# Patient Record
Sex: Female | Born: 2000 | Race: Black or African American | Hispanic: Refuse to answer | Marital: Single
Health system: Midwestern US, Community
[De-identification: ages and names within clinical notes are randomized; demographics above are authoritative.]

## PROBLEM LIST (undated history)

## (undated) ENCOUNTER — Inpatient Hospital Stay (HOSPITAL_COMMUNITY): Payer: Self-pay

## (undated) ENCOUNTER — Ambulatory Visit (HOSPITAL_COMMUNITY): Admission: EM | Payer: Medicaid Other | Source: Home / Self Care

## (undated) DIAGNOSIS — Z789 Other specified health status: Secondary | ICD-10-CM

## (undated) DIAGNOSIS — Z3046 Encounter for surveillance of implantable subdermal contraceptive: Secondary | ICD-10-CM

## (undated) HISTORY — PX: NO PAST SURGERIES: SHX2092

---
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
## 2020-01-01 NOTE — Discharge Summary (Signed)
With: Address: When:   Hayes Ludwig, Physician - Neurologist [address], [address] [place], [place] [address]  [phone] Business (1) Within 1 week   Comments:   Your CAT scan here was normal, but I am concerned about these new headaches you are experiencing. I am writing you for a different medication for headaches called Bupap. 4 hours if needed. Do not take it if you do not need it. I have also placed referral to neurology. Please call their office today and schedule a follow-up appointment for sometime next week. Please return for new or worsening symptoms.              ED PROVIDER DOCUMENTATION     Patient:   Barbara Hanna, Barbara Hanna             MRN: [id number]            FIN: [id number]               Age:   19 years     Sex:  Female     DOB:  2001/06/13   Associated Diagnoses:   Headache; Headache, new daily persistent (NDPH)   Author:   Lurlean Leyden K-FNP      Basic Information   Time seen: Provider Seen (ST)   ED Provider/Time:    HUFF,  MEGAN K-FNP / 01/01/2020 11:59  .   Additional information: Chief Complaint from Nursing Triage Note   Chief Complaint  Chief Complaint: Pt c/o daily headaches x4 months. Denies any other s/s. Seen at [facility] and was given pills that pt states have not helped. (01/01/20 11:54:00).      History of Present Illness   The patient presents with headache.  The onset was 6  weeks  ago.  The course/duration of symptoms is episodic: lasting 20 minutes and with a frequency of  4-6 times daily.  Location: Right temporal. The character of symptoms is sharp and throbbing.  The degree at onset was moderate.  The degree at maximum was severe.  The degree at present is minimal.  The exacerbating factor is flashing lights.  The relieving factor is rest.  Risk factors consist of none.  Therapy today: prescription medications including nsaid given by urgent care.  Preceding symptoms: none.  Associated symptoms: nausea and Watery right eye, facial tingling.        Review of Systems   Constitutional symptoms:  No fever, no chills.    Eye symptoms:  Vision unchanged, No diplopia,    ENMT symptoms:  No ear pain, no sore throat, no nasal congestion, no sinus pain.    Respiratory symptoms:  No shortness of breath, no cough.    Cardiovascular symptoms:  No chest pain, no palpitations.    Gastrointestinal symptoms:  Nausea, no abdominal pain, no vomiting, no diarrhea.    Genitourinary symptoms:  No dysuria, no hematuria.    Neurologic symptoms:  Headache, tingling, no numbness, no focal weakness.              Additional review of systems information: All other systems reviewed and otherwise negative.      Health Status   Allergies:    Allergic Reactions (All)  No Known Medication Allergies.      Past Medical/ Family/ Social History   Problem list:    No qualifying data available  .      Physical Examination  With: Address: When:   Hayes Ludwig, Physician - Neurologist [address], [address] [place], [place] [address]  [phone] Business (1) Within 1 week   Comments:   Your CAT scan here was normal, but I am concerned about these new headaches you are experiencing. I am writing you for a different medication for headaches called Bupap. 4 hours if needed. Do not take it if you do not need it. I have also placed referral to neurology. Please call their office today and schedule a follow-up appointment for sometime next week. Please return for new or worsening symptoms.              ED PROVIDER DOCUMENTATION     Patient:   Barbara Hanna, Barbara Hanna             MRN: [id number]            FIN: [id number]               Age:   19 years     Sex:  Female     DOB:  2001/06/13   Associated Diagnoses:   Headache; Headache, new daily persistent (NDPH)   Author:   Lurlean Leyden K-FNP      Basic Information   Time seen: Provider Seen (ST)   ED Provider/Time:    HUFF,  MEGAN K-FNP / 01/01/2020 11:59  .   Additional information: Chief Complaint from Nursing Triage Note   Chief Complaint  Chief Complaint: Pt c/o daily headaches x4 months. Denies any other s/s. Seen at [facility] and was given pills that pt states have not helped. (01/01/20 11:54:00).      History of Present Illness   The patient presents with headache.  The onset was 6  weeks  ago.  The course/duration of symptoms is episodic: lasting 20 minutes and with a frequency of  4-6 times daily.  Location: Right temporal. The character of symptoms is sharp and throbbing.  The degree at onset was moderate.  The degree at maximum was severe.  The degree at present is minimal.  The exacerbating factor is flashing lights.  The relieving factor is rest.  Risk factors consist of none.  Therapy today: prescription medications including nsaid given by urgent care.  Preceding symptoms: none.  Associated symptoms: nausea and Watery right eye, facial tingling.        Review of Systems   Constitutional symptoms:  No fever, no chills.    Eye symptoms:  Vision unchanged, No diplopia,    ENMT symptoms:  No ear pain, no sore throat, no nasal congestion, no sinus pain.    Respiratory symptoms:  No shortness of breath, no cough.    Cardiovascular symptoms:  No chest pain, no palpitations.    Gastrointestinal symptoms:  Nausea, no abdominal pain, no vomiting, no diarrhea.    Genitourinary symptoms:  No dysuria, no hematuria.    Neurologic symptoms:  Headache, tingling, no numbness, no focal weakness.              Additional review of systems information: All other systems reviewed and otherwise negative.      Health Status   Allergies:    Allergic Reactions (All)  No Known Medication Allergies.      Past Medical/ Family/ Social History   Problem list:    No qualifying data available  .      Physical Examination  ED Clinical Summary                     [facility]  [address]  [place], [place] [address]  [phone]          PERSON INFORMATION  Name: Barbara Hanna, Barbara Hanna Age:  61 Years DOB: [birth year]   Sex: Female Language: English PCP: PCP,  NONE   Marital Status:  Phone: [phone] Med Service: Harle Stanford   MRN: [id number] Acct# [id number] Arrival: 01/01/2020 11:52:00   Visit Reason: Headache; HEADACHE Acuity: 4 LOS: 000 02:36   Address:    [address] [place] [address]   Diagnosis:    Headache, new daily persistent (NDPH)  Medications:          New Medications  Printed Prescriptions  butalbital/acetaminophen/caffeine (butalbital/acetaminophen/caffeine 50 mg-325 mg-40 mg oral capsule) 1 Capsules Oral (given by mouth) every 4 hours as needed for pain for 7 Days. Refills: 0.  Last Dose:____________________      Medications Administered During Visit:              Allergies      No Known Medication Allergies      Major Tests and Procedures:  The following procedures and tests were performed during your ED visit.  COMMON PROCEDURES%>  COMMON PROCEDURES COMMENTS%>                PROVIDER INFORMATION               Provider Role Assigned Dorisann Frames K-FNP ED MidLevel 01/01/2020 11:59:17    Woody Seller RN, AMY ED Nurse 01/01/2020 12:00:48        Attending Physician:  DEFAULT,  DOCTOR      Admit Doc  DEFAULT,  DOCTOR     Consulting Doc       VITALS INFORMATION  Vital Sign Triage Latest   Temp Oral ORAL_1%> ORAL%>   Temp Temporal TEMPORAL_1%> TEMPORAL%>   Temp Intravascular INTRAVASCULAR_1%> INTRAVASCULAR%>   Temp Axillary AXILLARY_1%> AXILLARY%>   Temp Rectal RECTAL_1%> RECTAL%>   02 Sat 100 % 100 %   Respiratory Rate RATE_1%> RATE%>   Peripheral Pulse Rate PULSE RATE_1%> PULSE RATE%>   Apical Heart Rate HEART RATE_1%> HEART RATE%>   Blood Pressure BLOOD PRESSURE_1%>/ BLOOD PRESSURE_1%>77 mmHg BLOOD PRESSURE%> / BLOOD PRESSURE%>77 mmHg                 Immunizations      No  Immunizations Documented This Visit          DISCHARGE INFORMATION   Discharge Disposition: H Outpt-Sent Home   Discharge Location:  Home   Discharge Date and Time:  01/01/2020 14:28:38   ED Checkout Date and Time:  01/01/2020 14:28:38     DEPART REASON INCOMPLETE INFORMATION               Depart Action Incomplete Reason   Interactive View/I&O Recently assessed               Problems      No Problems Documented              Smoking Status      No Smoking Status Documented         PATIENT EDUCATION INFORMATION  Instructions:     General Headache Without Cause, Easy-to-Read     Follow up:  ED Clinical Summary                     [facility]  [address]  [place], [place] [address]  [phone]          PERSON INFORMATION  Name: Barbara Hanna, Barbara Hanna Age:  61 Years DOB: [birth year]   Sex: Female Language: English PCP: PCP,  NONE   Marital Status:  Phone: [phone] Med Service: Harle Stanford   MRN: [id number] Acct# [id number] Arrival: 01/01/2020 11:52:00   Visit Reason: Headache; HEADACHE Acuity: 4 LOS: 000 02:36   Address:    [address] [place] [address]   Diagnosis:    Headache, new daily persistent (NDPH)  Medications:          New Medications  Printed Prescriptions  butalbital/acetaminophen/caffeine (butalbital/acetaminophen/caffeine 50 mg-325 mg-40 mg oral capsule) 1 Capsules Oral (given by mouth) every 4 hours as needed for pain for 7 Days. Refills: 0.  Last Dose:____________________      Medications Administered During Visit:              Allergies      No Known Medication Allergies      Major Tests and Procedures:  The following procedures and tests were performed during your ED visit.  COMMON PROCEDURES%>  COMMON PROCEDURES COMMENTS%>                PROVIDER INFORMATION               Provider Role Assigned Dorisann Frames K-FNP ED MidLevel 01/01/2020 11:59:17    Woody Seller RN, AMY ED Nurse 01/01/2020 12:00:48        Attending Physician:  DEFAULT,  DOCTOR      Admit Doc  DEFAULT,  DOCTOR     Consulting Doc       VITALS INFORMATION  Vital Sign Triage Latest   Temp Oral ORAL_1%> ORAL%>   Temp Temporal TEMPORAL_1%> TEMPORAL%>   Temp Intravascular INTRAVASCULAR_1%> INTRAVASCULAR%>   Temp Axillary AXILLARY_1%> AXILLARY%>   Temp Rectal RECTAL_1%> RECTAL%>   02 Sat 100 % 100 %   Respiratory Rate RATE_1%> RATE%>   Peripheral Pulse Rate PULSE RATE_1%> PULSE RATE%>   Apical Heart Rate HEART RATE_1%> HEART RATE%>   Blood Pressure BLOOD PRESSURE_1%>/ BLOOD PRESSURE_1%>77 mmHg BLOOD PRESSURE%> / BLOOD PRESSURE%>77 mmHg                 Immunizations      No  Immunizations Documented This Visit          DISCHARGE INFORMATION   Discharge Disposition: H Outpt-Sent Home   Discharge Location:  Home   Discharge Date and Time:  01/01/2020 14:28:38   ED Checkout Date and Time:  01/01/2020 14:28:38     DEPART REASON INCOMPLETE INFORMATION               Depart Action Incomplete Reason   Interactive View/I&O Recently assessed               Problems      No Problems Documented              Smoking Status      No Smoking Status Documented         PATIENT EDUCATION INFORMATION  Instructions:     General Headache Without Cause, Easy-to-Read     Follow up:

## 2020-01-01 NOTE — ED Notes (Signed)
ED Triage Note       ED Triage Adult Entered On:  01/01/2020 11:56 EDT    Performed On:  01/01/2020 11:54 EDT by Dimas Millin, RN, [place] [facility]               Triage   Chief Complaint :   Pt c/o daily headaches x4 months. Denies any other s/s. Seen at [facility] and was given pills that pt states have not helped.    Numeric Rating Pain Scale :   10 = Worst possible pain   [place] Mode of Arrival :   Private vehicle   Infectious Disease Documentation :   Document assessment   Temperature Oral :   36.6 degC(Converted to: 97.9 degF)    Heart Rate Monitored :   84 bpm   Respiratory Rate :   16 br/min   Systolic Blood Pressure :   376 mmHg   Diastolic Blood Pressure :   77 mmHg   SpO2 :   100 %   Oxygen Therapy :   Room air   Patient presentation :   None of the above   Chief Complaint or Presentation suggest infection :   No   Dosing Weight Obtained By :   Patient stated   Weight Dosing :   86.4 kg(Converted to: 190 lb 8 oz)    Height :   165.1 cm(Converted to: 5 ft 5 in)    Body Mass Index Dosing :   32 kg/m2   MALONE, RN, JENNIFER L - 01/01/2020 11:54 EDT   DCP GENERIC CODE   Tracking Acuity :   4   Tracking Group :   ED [id number] [employer]   Burke, RN, [employer] L - 01/01/2020 11:54 EDT   ED General Section :   Document assessment   Pregnancy Status :   Patient denies   ED Allergies Section :   Document assessment   ED Reason for Visit Section :   Document assessment   ED Quick Assessment :   Patient appears awake, alert, oriented to baseline. Skin warm and dry. Moves all extremities. Respiration even and unlabored. Appears in no apparent distress.   MALONE, RN, JENNIFER L - 01/01/2020 11:54 EDT   ID Risk Screen Symptoms   Recent Travel History :   No recent travel   Last 14 days COVID-19 ID :   No   TB Symptom Screen :   No symptoms   C. diff Symptom/History ID :   Neither of the above   MALONE, RN, JENNIFER L - 01/01/2020 11:54 EDT   Allergies   (As Of: 01/01/2020 11:56:57 EDT)   Allergies (Active)   No Known Medication Allergies   Estimated Onset Date:   Unspecified ; Created ByDimas Millin, RN, JENNIFER L; Reaction Status:   Active ; Category:   Drug ; Substance:   No Known Medication Allergies ; Type:   Allergy ; Updated By:   Dimas Millin RN, [employer]; Reviewed Date:   01/01/2020 11:55 EDT        Psycho-Social   Last 3 mo, thoughts killing self/others :   Patient denies   Right click within box for Suspected Abuse policy link. :   None   Feels Safe Where Live :   Yes   MALONE, RN, JENNIFER L - 01/01/2020 11:54 EDT   ED Reason for Visit   (As Of: 01/01/2020 11:56:57 EDT)   Diagnoses(Active)    Headache  Date:  01/01/2020 ; Diagnosis Type:   Reason For Visit ; Confirmation:   Complaint of ; Clinical Dx:   Headache ; Classification:   Medical ; Clinical Service:   Emergency medicine ; Code:   PNED ; Probability:   0 ; Diagnosis Code:   [phone]

## 2020-01-01 NOTE — ED Provider Notes (Signed)
Headache        Patient:   Barbara Hanna, Barbara Hanna             MRN: [id number]            FIN: [id number]               Age:   19 years     Sex:  Female     DOB:  04-15-2001   Associated Diagnoses:   Headache; Headache, new daily persistent (NDPH)   Author:   Merril Abbe K-FNP      Basic Information   Time seen: Provider Seen (ST)   ED Provider/Time:    Leighton Luster,  Sapir Lavey K-FNP / 01/01/2020 11:59  .   Additional information: Chief Complaint from Nursing Triage Note   Chief Complaint  Chief Complaint: Pt c/o daily headaches x4 months. Denies any other s/s. [facility]en [facility] and was given pills that pt states have not helped. (01/01/20 11:54:00).      History of Present Illness   The patient presents with headache.  The onset was 6  weeks ago.  The course/duration of symptoms is episodic: lasting 20 minutes and with a frequency of  4-6 times daily.  Location: Right temporal. The character of symptoms is sharp and throbbing.  The degree at onset was moderate.  The degree at maximum was severe.  The degree at present is minimal.  The exacerbating factor is flashing lights.  The relieving factor is rest.  Risk factors consist of none.  Therapy today: prescription medications including nsaid given by urgent care.  Preceding symptoms: none.  Associated symptoms: nausea and Watery right eye, facial tingling.        Review of Systems   Constitutional symptoms:  No fever, no chills.    Eye symptoms:  Vision unchanged, No diplopia,    ENMT symptoms:  No ear pain, no sore throat, no nasal congestion, no sinus pain.    Respiratory symptoms:  No shortness of breath, no cough.    Cardiovascular symptoms:  No chest pain, no palpitations.    Gastrointestinal symptoms:  Nausea, no abdominal pain, no vomiting, no diarrhea.    Genitourinary symptoms:  No dysuria, no hematuria.    Neurologic symptoms:  Headache, tingling, no numbness, no focal weakness.              Additional review of systems information: All other systems reviewed and otherwise  negative.      Health Status   Allergies:    Allergic Reactions (All)  No Known Medication Allergies.      Past Medical/ Family/ Social History   Problem list:    No qualifying data available  .      Physical Examination               Vital Signs   Vital Signs   01/01/2020 12:05 EDT Respiratory Rate 16 br/min   5/78/4696 29:52 EDT Systolic Blood Pressure 841 mmHg    Diastolic Blood Pressure 77 mmHg    Temperature Oral 36.6 degC    Heart Rate Monitored 84 bpm    Respiratory Rate 16 br/min    SpO2 100 %   .   Measurements   01/01/2020 11:56 EDT Body Mass Index est meas 31.70 kg/m2   01/01/2020 11:56 EDT Body Mass Index Measured 31.70 kg/m2   01/01/2020 11:54 EDT Height/Length Measured 165.1 cm    Weight Dosing 86.4 kg   .   Basic Oxygen Information  01/01/2020 11:54 EDT Oxygen Therapy Room air    SpO2 100 %   .   General:  Alert, no acute distress.    Skin:  Warm, dry, normal for ethnicity.    Head:  Normocephalic, atraumatic, No facial tenderness.    Eye:  Pupils are equal, round and reactive to light, extraocular movements are intact, normal conjunctiva, vision grossly normal.    Ears, nose, mouth and throat:  Oral mucosa moist.   Cardiovascular:  Regular rate and rhythm, No murmur, Normal peripheral perfusion, No edema.    Respiratory:  Lungs are clear to auscultation, respirations are non-labored, breath sounds are equal.    Gastrointestinal:  Soft, Nontender.    Neurological:  Alert and oriented to person, place, time, and situation, No focal neurological deficit observed, Cranial nerves II - XII: Intact, Coordination: Finger(s) to nose (bilateral, normal), Speech: Normal.    Psychiatric:  Cooperative, appropriate mood & affect.       Medical Decision Making   Differential Diagnosis:  Migraine, intracranial hemorrhage, Mass, cluster headache.    Radiology results:  Rad Results (ST)   CT Head or Brain w/o Contrast  ?  01/01/20 13:58:53      History: Headache, secondary;ACR Select headaches for 4 months    Technique:  Noncontrast axial images of the brain were obtained from the foramen  magnum to the vertex. Iterative reconstruction/Dose reduction techniques were  utilized.    Comparison: none    Findings: The ventricles and sulci are within normal limits for the patients  age. There is no midline shift or mass effect. The basal cisterns are intact.  The gray-white differentiation is maintained.    No acute intracranial hemorrhage or extra-axial fluid collection is identified.  There are no areas of abnormal parenchymal attenuation.    The osseous structures are unremarkable. The visualized paranasal sinuses and  mastoid air cells are clear.    Impression:  No acute intracranial abnormality  ?  Signed By: Kristine Garbe A-MD  .      Impression and Plan   Diagnosis   Complaint of Headache (PNED [phone], Reason For Visit, Emergency medicine, Medical)   Headache, new daily persistent (NDPH) (ICD10-CM G44.52, Discharge, Medical)   Plan   Disposition: Medically cleared, Discharged: to home.    Prescriptions: Launch prescriptions   Pharmacy:  butalbital/acetaminophen/caffeine 50 mg-325 mg-40 mg oral capsule (Prescribe): 1 caps, Oral, q4hr, for 7 days, PRN: for pain, 30 caps, 0 Refill(s).    Patient was given the following educational materials: General Headache Without Cause, Easy-to-Read.    Follow up with: Hayes Ludwig, Physician - Neurologist Within 1 week Your CAT scan here was normal, but I am concerned about these new headaches you are experiencing.  I am writing you for a different medication for headaches called Bupap.  4 hours if needed.  Do not take it if you do not need it.  I have also placed referral to neurology.  Please call their office today and schedule a follow-up appointment for sometime next week.  Please return for new or worsening symptoms..    Counseled: I had a detailed discussion with the patient and/or guardian regarding the historical points/exam findings supporting the  discharge diagnosis and need for outpatient followup. Discussed the need to return to the ER if symptoms persist/worsen, or for any questions/concerns that arise at home.    Signature Line     Electronically Signed on 01/01/2020 02:22 PM EDT   ________________________________________________   Lurlean Leyden  K-FNP      Electronically Signed on 01/01/2020 08:39 PM EDT   ________________________________________________   Levy Pupa CLIVE-MD            Modified by: Lurlean Leyden K-FNP on 01/01/2020 02:22 PM EDT

## 2020-01-01 NOTE — ED Notes (Signed)
 ED Patient Summary       ;       [facility] Emergency Department  [address], [place] [address]  [phone]  Discharge Instructions (Patient)  _______________________________________     Name: Barbara Hanna, Barbara Hanna  DOB: 2001/01/15                   MRN: [id number]                   FIN: [id number]  Reason For Visit: Headache; HEADACHE  Final Diagnosis: Headache, new daily persistent (NDPH)     Visit Date: 01/01/2020 11:52:00  Address: [address] [place] [address]  Phone: [phone]     Emergency Department Providers:         Primary Physician:            [facility] would like to thank you for allowing us  to assist you with your healthcare needs. The following includes patient education materials and information regarding your injury/illness.     Follow-up Instructions:  You were seen today on an emergency basis. Please contact your primary care doctor for a follow up appointment. If you received a referral to a specialist doctor, it is important you follow-up as instructed.    It is important that you call your follow-up doctor to schedule and confirm the location of your next appointment. Your doctor may practice at multiple locations. The office location of your follow-up appointment may be different to the one written on your discharge instructions.    If you do not have a primary care doctor, please call [phone] for help in finding a [name] Provider. For help in finding a specialist doctor, please call [phone].    The [facility] "Ask a Nurse" line in staffed by Registered Nurses and is a free service to the community. We are available Monday - Friday from 8am to 5pm to answer your questions about your health. Please call [phone].    If your condition gets worse before your follow-up with your primary care doctor or specialist, please return to the Emergency Department.        Coronavirus 2019 (COVID-19)  Reminders:     Patients aged 13 and older, people with increased risk for severe COVID-19 disease, or frontline workers with increased occupational risk can make an appointment for a COVID-19 vaccine. Patients can contact their [name] doctors' offices to schedule an appointment to receive the COVID-19 vaccine at the [facility] or send us  an email at [email or web address]. Patients who do not have a [name] physician can call [phone] to schedule vaccination appointments.            Scan this code with your phone camera to send an email to the address above.          Follow Up Appointments:  Primary Care Provider:      Name: PCP,  NONE      Phone:                  With: Address: When:   HARLENE LACKS, Physician - Neurologist [address], [address] [place], [place] [address]  [phone] Business (1) Within 1 week   Comments:   Your CAT scan here was normal, but I am concerned about these new headaches you are experiencing. I am writing you for a different medication for  headaches called Bupap. 4 hours if needed. Do not take it if you do not need it. I have also placed referral to neurology. Please call their office today and schedule a follow-up appointment for sometime next week. Please return for new or worsening symptoms.              Printed Prescriptions:    Patient Education Materials:  Discharge Orders          Discharge Patient 01/01/20 14:23:00 EDT         Comment:      General Headache Without Cause, Easy-to-Read     General Headache Without Cause    A headache is pain or discomfort felt around the head or neck area. There are many causes and types of headaches. In some cases, the cause may not be found.       HOME CARE    Managing Pain     Take over-the-counter and prescription medicines only as told by your doctor.      Lie down in a dark, quiet room when you have a headache.      If directed, apply ice to the head and neck area:      ? Put ice in a plastic bag.     ? Place a towel between your skin and the bag.     ? Leave the ice on for 20 minutes, 2?3 times per day.      Use a heating pad or hot shower to apply heat to the head and neck area as told by your doctor.      Keep lights dim if bright lights bother you or make your headaches worse.     Eating and Drinking     Eat meals on a regular schedule.     Lessen how much alcohol you drink.      Lessen how much caffeine you drink, or stop drinking caffeine.     General Instructions     Keep all follow-up visits as told by your doctor. This is important.     Keep a journal to find out if certain things bring on headaches. For example, write down:    ? What you eat and drink.     ? How much sleep you get.     ? Any change to your diet or medicines.      Relax by getting a massage or doing other relaxing activities.      Lessen stress.      Sit up straight. Do not tighten (tense) your muscles.      Do not use tobacco products. This includes cigarettes, chewing tobacco, or e-cigarettes. If you need help quitting, ask your doctor.      Exercise regularly as told by your doctor.      Get enough sleep. This often means 7?9 hours of sleep.    GET HELP IF:     Your symptoms are not helped by medicine.     You have a headache that feels different than the other headaches.      You feel sick to your stomach (nauseous) or you throw up (vomit).      You have a fever.     GET HELP RIGHT AWAY IF:     Your headache becomes really bad.     You keep throwing up.     You have a stiff neck.     You have trouble seeing.     You have trouble  speaking.      You have pain in the eye or ear.      Your muscles are weak or you lose muscle control.      You lose your balance or have trouble walking.      You feel like you will pass out (faint) or you pass out.     You have confusion.    This information is not intended to replace advice given to you by your health care provider. Make sure you discuss any questions you  have with your health care provider.    Document Released: 06/05/2008 Document Revised: 05/18/2015 Document Reviewed: 12/20/2014  Elsevier Interactive Patient Education ?2016 Elsevier Inc.         Allergy Info: No Known Medication Allergies     Medication Information:  [facility] provided you with a complete list of medications post discharge, if you have been instructed to stop taking a medication please ensure you also follow up with this information to your Primary Care Physician.  Unless otherwise noted, patient will continue to take medications as prescribed prior to the Emergency Room visit.  Any specific questions regarding your chronic medications and dosages should be discussed with your physician(s) and pharmacist.          butalbital/acetaminophen /caffeine (butalbital/acetaminophen /caffeine 50 mg-325 mg-40 mg oral capsule) 1 Capsules Oral (given by mouth) every 4 hours as needed for pain for 7 Days. Refills: 0.      Medications Administered During Visit:       Major Tests and Procedures:  The following procedures and tests were performed during your ED visit.  COMMON PROCEDURES%>  COMMON PROCEDURES COMMENTS%>          Laboratory Orders  No laboratory orders were placed.              Radiology Orders  Name Status Details   CT Head or Brain w/o Contrast Completed 01/01/20 12:52:00 EDT, STAT 1 hour or less, Reason: ACR Select, Reason: Headache, secondary, Transport Mode: STRETCHER, Rad Type, pp_set_radiology_subspecialty, [id number], 5               Patient Care Orders  Name Status Details   Discharge Patient Ordered 01/01/20 14:23:00 EDT   ED Assessment Adult Completed 01/01/20 11:56:58 EDT, 01/01/20 11:56:58 EDT   ED Secondary Triage Completed 01/01/20 11:56:58 EDT, 01/01/20 11:56:58 EDT   ED Triage Adult Completed 01/01/20 11:52:51 EDT, 01/01/20 11:52:51 EDT        ---------------------------------------------------------------------------------------------------------------------  [name]) encourages you to self-enroll in the [facility] Patient Portal.  [facility] Patient Portal will allow you to manage your personal health information securely from your own electronic device now and in the future.  To begin your Patient Portal enrollment process, please visit [email or web address] Click on "Sign up now" under [facility].  If you find that you need additional assistance on the [facility] Patient Portal or need a copy of your medical records, please call the [facility] Medical Records Office at [phone].  Comment:

## 2020-01-01 NOTE — ED Notes (Signed)
ED Patient Education Note     Patient Education Materials Follows:  Easy-to-Read     General Headache Without Cause    A headache is pain or discomfort felt around the head or neck area. There are many causes and types of headaches. In some cases, the cause may not be found.       HOME CARE    Managing Pain     Take over-the-counter and prescription medicines only as told by your doctor.      Lie down in a dark, quiet room when you have a headache.      If directed, apply ice to the head and neck area:     ? Put ice in a plastic bag.     ? Place a towel between your skin and the bag.     ? Leave the ice on for 20 minutes, 2?3 times per day.      Use a heating pad or hot shower to apply heat to the head and neck area as told by your doctor.      Keep lights dim if bright lights bother you or make your headaches worse.     Eating and Drinking     Eat meals on a regular schedule.     Lessen how much alcohol you drink.      Lessen how much caffeine you drink, or stop drinking caffeine.     General Instructions     Keep all follow-up visits as told by your doctor. This is important.     Keep a journal to find out if certain things bring on headaches. For example, write down:    ? What you eat and drink.     ? How much sleep you get.     ? Any change to your diet or medicines.      Relax by getting a massage or doing other relaxing activities.      Lessen stress.      Sit up straight. Do not tighten (tense) your muscles.      Do not use tobacco products. This includes cigarettes, chewing tobacco, or e-cigarettes. If you need help quitting, ask your doctor.      Exercise regularly as told by your doctor.      Get enough sleep. This often means 7?9 hours of sleep.    GET HELP IF:     Your symptoms are not helped by medicine.     You have a headache that feels different than the other headaches.      You feel sick to your stomach (nauseous) or you throw up (vomit).      You have a fever.     GET HELP RIGHT AWAY IF:     Your  headache becomes really bad.     You keep throwing up.     You have a stiff neck.     You have trouble seeing.     You have trouble speaking.      You have pain in the eye or ear.      Your muscles are weak or you lose muscle control.      You lose your balance or have trouble walking.      You feel like you will pass out (faint) or you pass out.     You have confusion.    This information is not intended to replace advice given to you by your health care provider. Make sure you discuss   any questions you have with your health care provider.    Document Released: 06/05/2008 Document Revised: 05/18/2015 Document Reviewed: 12/20/2014  Elsevier Interactive Patient Education ?2016 Elsevier Inc.

## 2020-01-01 NOTE — ED Notes (Signed)
ED Triage Note       ED Secondary Triage Entered On:  01/01/2020 12:05 EDT    Performed On:  01/01/2020 12:05 EDT by Woody Seller, RN, AMY               General Information   Barriers to Learning :   None evident   ED Home Meds Section :   Document assessment   [facility] ED Fall Risk Section :   Document assessment   ED Advance Directives Section :   Document assessment   ED Palliative Screen :   N/A (prefilled for <19yo)   GUESS-MATTHEWS, RN, AMY - 01/01/2020 12:05 EDT   (As Of: 01/01/2020 12:05:53 EDT)   Diagnoses(Active)    Headache  Date:   01/01/2020 ; Diagnosis Type:   Reason For Visit ; Confirmation:   Complaint of ; Clinical Dx:   Headache ; Classification:   Medical ; Clinical Service:   Emergency medicine ; Code:   PNED ; Probability:   0 ; Diagnosis Code:   [id number]             -    Procedure History   (As Of: 01/01/2020 12:05:53 EDT)     [facility] Fall Risk Assessment Tool   Hx of falling last 3 months ED Fall :   No   Patient confused or disoriented ED Fall :   No   Patient intoxicated or sedated ED Fall :   No   Patient impaired gait ED Fall :   No   Use a mobility assistance device ED Fall :   No   Patient altered elimination ED Fall :   No   [facility] ED Fall Score :   0    GUESS-MATTHEWS, RN, AMY - 01/01/2020 12:05 EDT   ED Advance Directive   Advance Directive :   No   GUESS-MATTHEWS, RN, AMY - 01/01/2020 12:05 EDT   Med Hx   Medication List   (As Of: 01/01/2020 12:05:53 EDT)

## 2020-05-02 IMAGING — US US MFM OB LIMITED
1 series · 15 of 27 positions shown · non-contrast
Comparison: none

[Series 1: us mfm ob limited · 27 acquisitions, 15 frames shown]
[im 1/27]
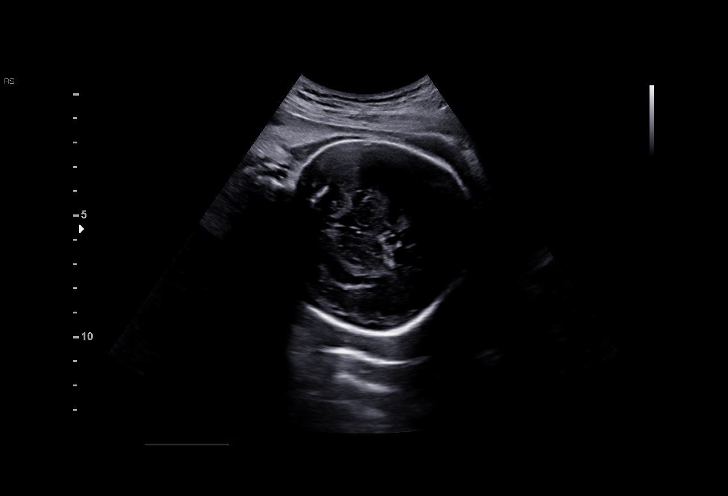
[im 3/27]
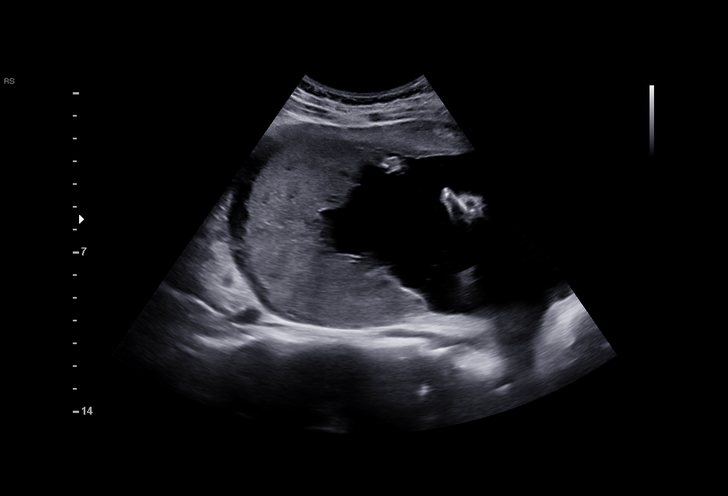
[im 5/27]
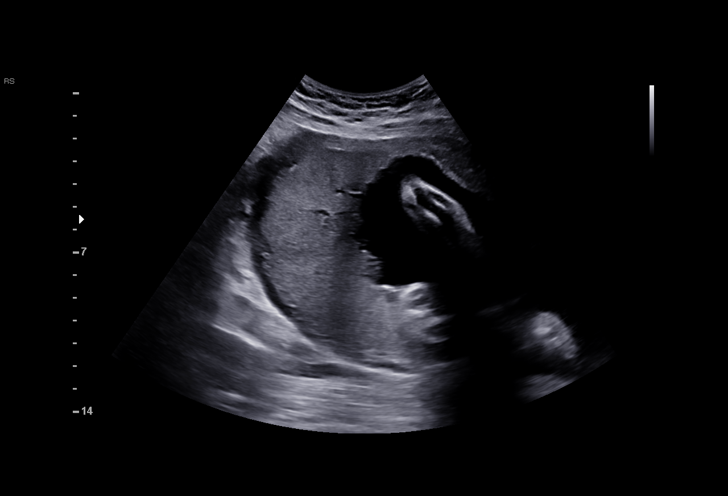
[im 7/27]
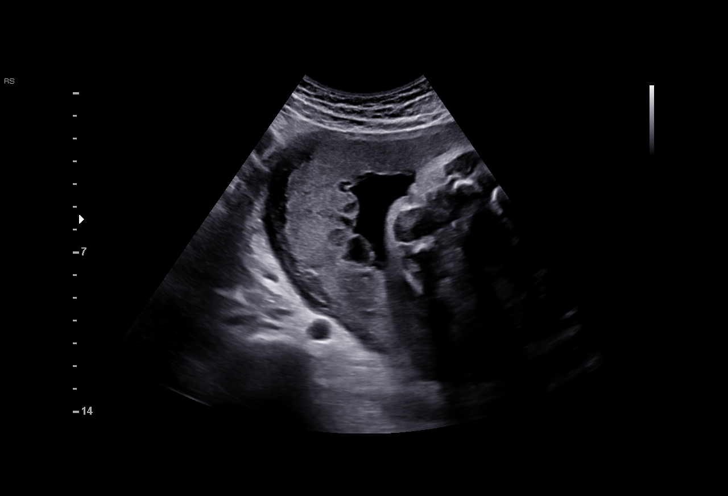
[im 9/27]
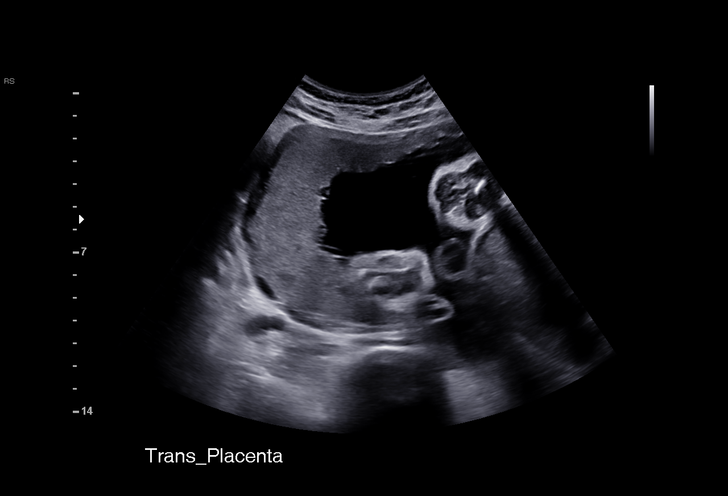
[im 10/27]
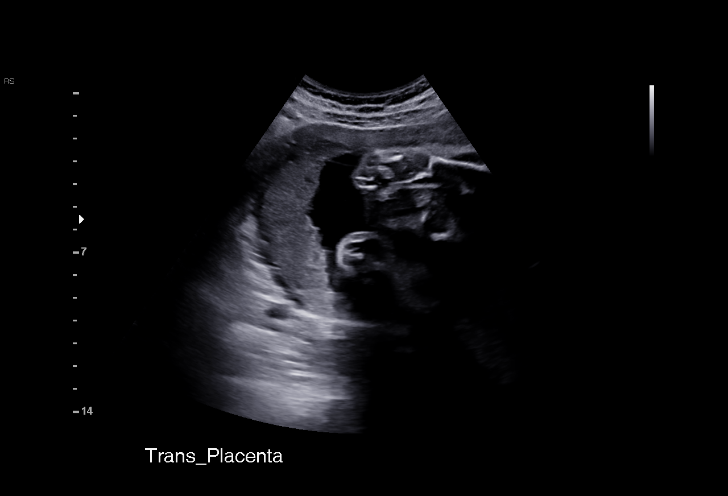
[im 12/27]
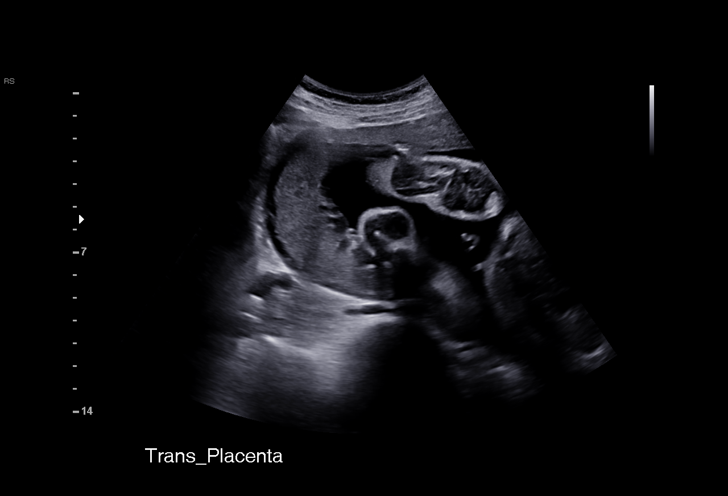
[im 14/27]
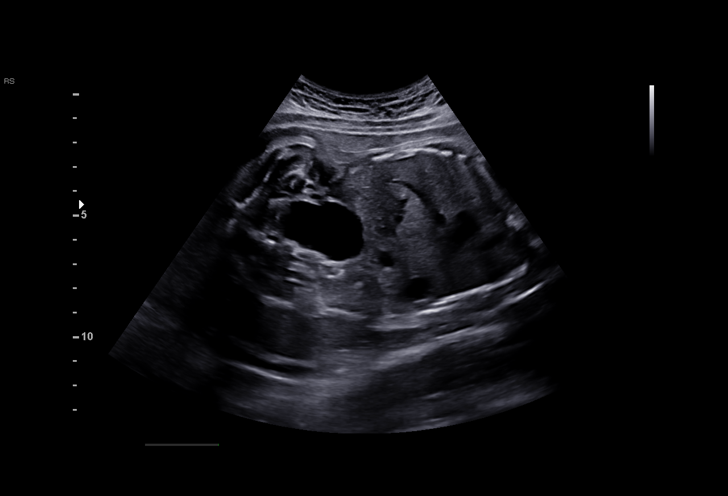
[im 16/27]
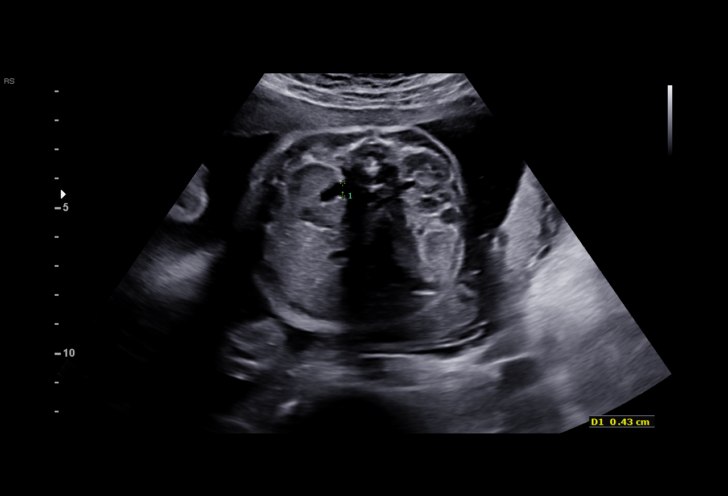
[im 18/27]
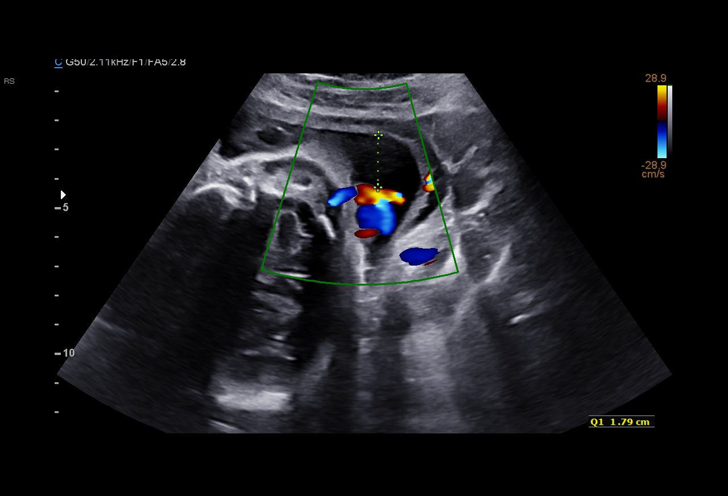
[im 19/27]
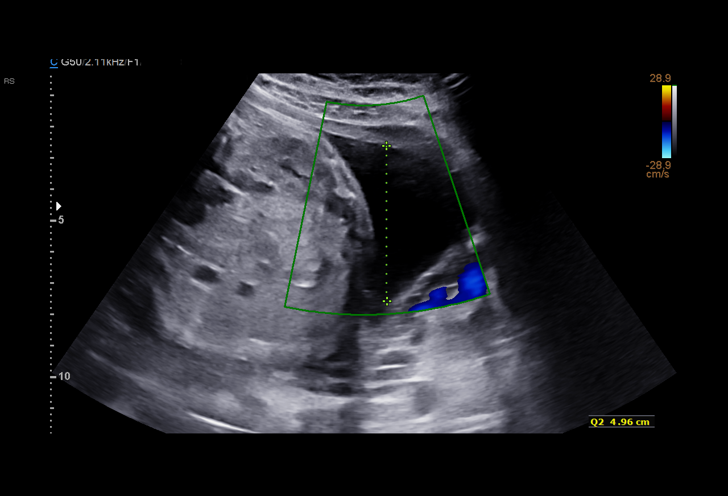
[im 21/27]
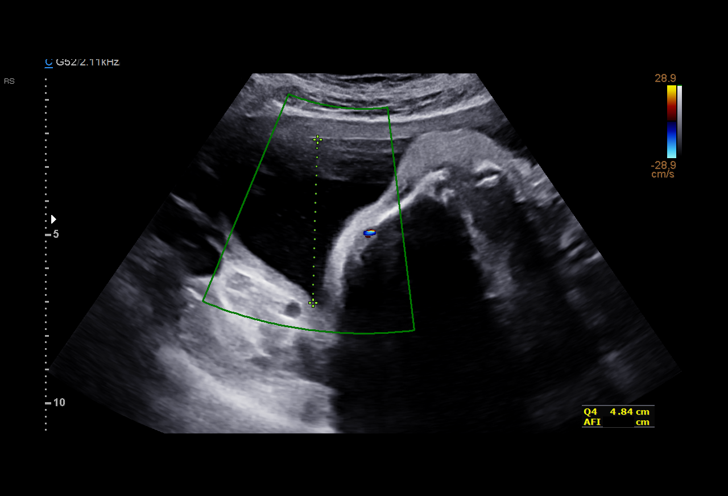
[im 23/27]
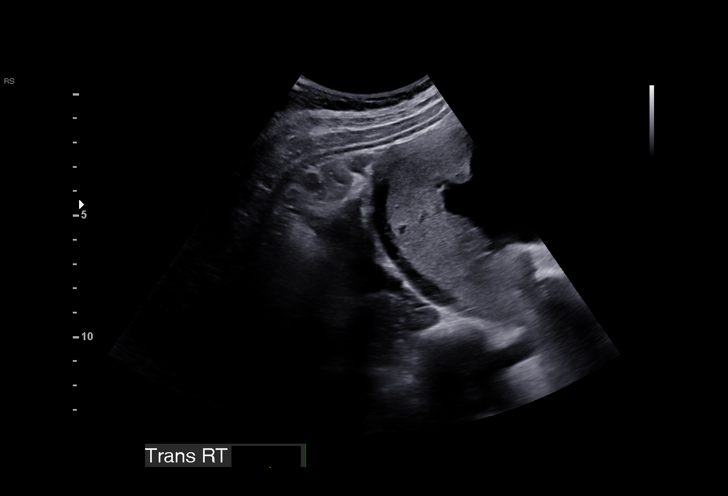
[im 25/27]
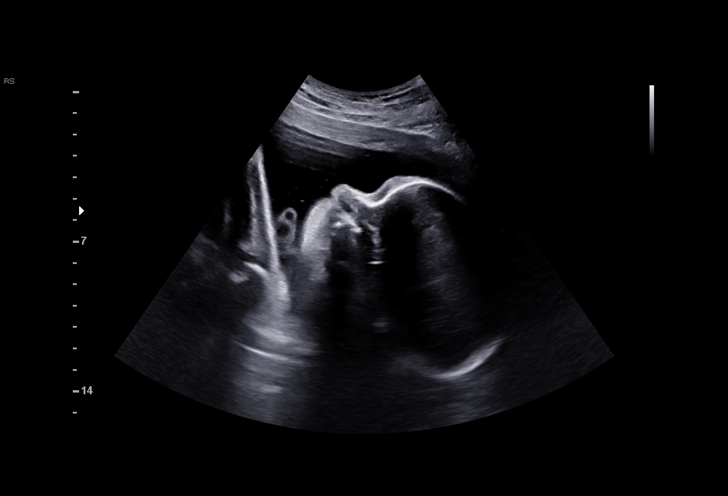
[im 27/27]
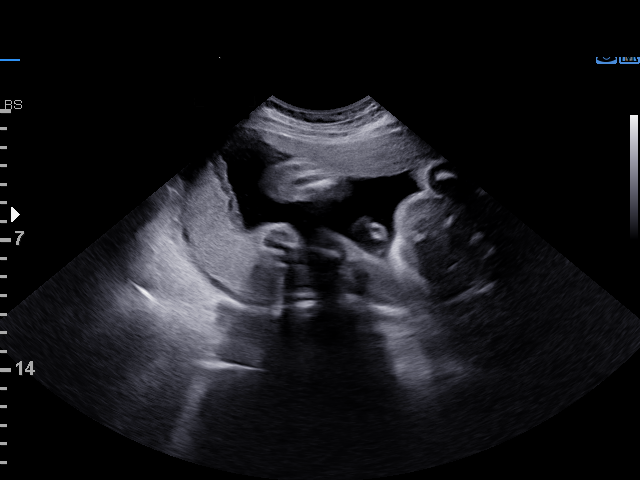

[15 of 27 positions shown; findings below may reference images not displayed]

----------------------------------------------------------------------

 ----------------------------------------------------------------------
Indications

  Vaginal bleeding in pregnancy, third trimester
  Oligohydramnios / Decreased amniotic fluid
  volume
  Late to prenatal care, third trimester
  31 weeks gestation of pregnancy
 ----------------------------------------------------------------------
Fetal Evaluation

 Num Of Fetuses:         1
 Fetal Heart Rate(bpm):  146
 Cardiac Activity:       Observed
 Presentation:           Cephalic
 Placenta:               Fundal

 Amniotic Fluid
 AFI FV:      Within normal limits

 AFI Sum(cm)     %Tile       Largest Pocket(cm)
 15.31           54

 RUQ(cm)       RLQ(cm)       LUQ(cm)        LLQ(cm)

OB History

 Gravidity:    1
Gestational Age

 Best:          31w 3d     Det. By:  Previous Ultrasound      EDD:   09/22/18
                                     (07/24/18)
Anatomy
 Stomach:               Appears normal, left   Bladder:                Appears normal
                        sided
 Kidneys:               Appear normal
Cervix Uterus Adnexa

 Cervix
 Not visualized (advanced GA >64wks)

 Uterus
 No abnormality visualized.

 Left Ovary
 Within normal limits.

 Right Ovary
 Not visualized.

 Adnexa
 No abnormality visualized. No adnexal mass
 visualized.
Comments

 U/S images reviewed.  No fetal abnormalities are seen.  No
 evidence of placental; abruption or previa is found on U/S
 today.  Please remember that the absence of a clot on U/S
 does not rule out an abruption.
 Recommendations: 1) Serial U/S every 4 weeks for fetal
 growth  2) Close A-P surveillance
Recommendations

  1) Serial U/S every 4 weeks for fetal growth  2) Close A-P
 surveillance

              Raw[place]inson, [place]

## 2020-08-15 LAB — POC URINALYSIS, CHEMISTRY
Bilirubin, Urine, POC: NEGATIVE
Glucose, UA POC: NEGATIVE mg/dL
Ketones, Urine, POC: NEGATIVE mg/dL
Nitrate, UA POC: NEGATIVE
Specific Gravity, Urine, POC: >=1.03 — AB (ref 1.003–1.035)
UROBILIN U POC: 0.2 EU/dL
pH, Urine, POC: 6 (ref 4.5–8.0)

## 2020-08-15 LAB — POC PREGNANCY UR-QUAL: Preg Test, Ur: NEGATIVE

## 2020-08-15 NOTE — ED Notes (Signed)
ED Triage Note       ED Triage Adult Entered On:  08/15/2020 13:00 EST    Performed On:  08/15/2020 12:57 EST by Timlin, RN, Tiffany A               Triage   Numeric Rating Pain Scale :   7   Chief Complaint :   vaginal pain states there is a knot down there, its not like in gorwn hair more like inside cyst, hard to sit down, waked in no distress    [place] Mode of Arrival :   Private vehicle   Infectious Disease Documentation :   Document assessment   Temperature Oral :   36.8 degC(Converted to: 98.2 degF)    Heart Rate Monitored :   78 bpm   Respiratory Rate :   18 br/min   Systolic Blood Pressure :   131 mmHg   Diastolic Blood Pressure :   81 mmHg   SpO2 :   98 %   Oxygen Therapy :   Room air   Patient presentation :   None of the above   Chief Complaint or Presentation suggest infection :   Yes   Dosing Weight Obtained By :   Measured   Weight Dosing :   88.6 kg(Converted to: 195 lb 5 oz)    Height :   163 cm(Converted to: 5 ft 4 in)    Body Mass Index Dosing :   33 kg/m2   Timlin, RN, Tiffany A - 08/15/2020 12:57 EST   DCP GENERIC CODE   Tracking Acuity :   3   Tracking Group :   ED [employer]   Worthing, RN, Elmarie Shiley A - 08/15/2020 12:57 EST   ED General Section :   Document assessment   Pregnancy Status :   Patient denies   ED Allergies Section :   Document assessment   ED Reason for Visit Section :   Document assessment   Timlin, RN, Tiffany A - 08/15/2020 12:57 EST   ID Risk Screen Symptoms   Recent Travel History :   No recent travel   Close Contact with COVID-19 ID :   No   Last 14 days COVID-19 ID :   No   Timlin, RN, Tiffany A - 08/15/2020 12:57 EST   Allergies   (As Of: 08/15/2020 13:00:46 EST)   Allergies (Active)   No Known Medication Allergies  Estimated Onset Date:   Unspecified ; Created ByLeanna Battles, RN, JENNIFER L; Reaction Status:   Active ; Category:   Drug ; Substance:   No Known Medication Allergies ; Type:   Allergy ; Updated By:   Leanna Battles, RN, Lise Auer; Reviewed Date:   08/15/2020  12:58 EST        Psycho-Social   Last 3 mo, thoughts killing self/others :   Patient denies   Right click within box for Suspected Abuse policy link. :   None   Feels Safe Where Live :   Yes   Timlin, RN, Tiffany A - 08/15/2020 12:57 EST   ED Reason for Visit   (As Of: 08/15/2020 13:00:46 EST)   Diagnoses(Active)    Vaginal pain  Date:   08/15/2020 ; Diagnosis Type:   Reason For Visit ; Confirmation:   Complaint of ; Clinical Dx:   Vaginal pain ; Classification:   Medical ; Clinical Service:   Emergency medicine ; Code:   PNED ; Probability:  0 ; Diagnosis Code:   [id number]

## 2020-08-15 NOTE — ED Notes (Signed)
 ED Patient Summary       ;          [facility]  [address] [place], [place] [address]  [phone]  Discharge Instructions (Patient)  Barbara Hanna, Barbara Hanna  DOB:  [birth year]                   MRN: [id number]                   FIN: [id number]  Reason For Visit: Vaginal pain; VAGINAL PAIN  Final Diagnosis: Bartholin gland cyst     Visit Date: 08/15/2020 12:50:00  Address: [address] [place] [address]  Phone: [phone]     Emergency Department Providers:         Primary Physician:      MCNEILL, KATHERINE E      [facility] would like to thank you for allowing us  to assist you with your healthcare needs. The following includes patient education materials and information regarding your injury/illness.     Follow-up Instructions:  You were seen today on an emergency basis. Please contact your primary care doctor for a follow up appointment. If you received a referral to a specialist doctor, it is important you follow-up as instructed.    It is important that you call your follow-up doctor to schedule and confirm the location of your next appointment. Your doctor may practice at multiple locations. The office location of your follow-up appointment may be different to the one written on your discharge instructions.    If you do not have a primary care doctor, please call [phone] for help in finding a [name] Provider. For help in finding a specialist doctor, please call [phone].    If your condition gets worse before your follow-up with your primary care doctor or specialist, please return to the Emergency Department.      Coronavirus 2019 (COVID-19) Reminders:     Patients age 63 - 46, with parental consent, and over age 62 can make an appointment for a COVID-19 vaccine. Patients can contact their [name] doctors' offices to schedule an appointment to receive the COVID-19 vaccine. Patients who do not  have a [name] physician can call [phone] to schedule vaccination appointments.      Follow Up Appointments:  Primary Care Provider:     Name: PCP,  NONE     Phone:                  With: Address: When:   Follow-up with your OB GYN Dr. Joan tomorrow as scheduled  In 1 day   Comments:   You can take the medications I have given you for pain. Take the antibiotics I have given you until completion as directed. Tylenol  for added relief as well as warm compress as we discussed. Return to the emergency department for any new or changing symptoms overnight.                   New Medications  Printed Prescriptions  cephalexin (Keflex 500 mg oral capsule) 1 Capsules Oral (given by mouth) 4 times a day for 7 Days. Refills: 0.  Last Dose:____________________  naproxen (Naprosyn 500 mg oral tablet) 1 Tabs Oral (given by mouth) 2 times a day for 7 Days. Refills: 0.  Last Dose:____________________  sulfamethoxazole-trimethoprim (Bactrim DS 800 mg-160 mg oral tablet) 1 Tabs Oral (given by mouth) 2  times a day for 7 Days. Refills: 0.  Last Dose:____________________      Allergy Info: No Known Medication Allergies     Discharge Additional Information          Discharge Patient 08/15/20 14:41:00 EST      Patient Education Materials:        Bartholin Cyst or Abscess    A Bartholin cyst is a fluid-filled sac that forms on a Bartholin gland. Bartholin glands are small glands that are found in the folds of skin (labia) on the sides of the lower opening of the vagina.    This type of cyst causes a bulge on the side of the vagina. A cyst that is not large or infected may not cause problems. However, if the fluid in the cyst becomes infected, the cyst can turn into an abscess. An abscess may cause discomfort or pain.      HOME CARE     Take medicines only as told by your doctor.     If you were prescribed an antibiotic medicine, finish all of it even if you start to feel better.     Apply warm, wet compresses to the area  or take warm, shallow baths that cover your pelvic area (sitz baths). Do this many times each day or as told by your doctor.     Do not squeeze the cyst. Do not apply heavy pressure to it.     Do not have sex until the cyst has gone away.     If your cyst or abscess was opened by your doctor, a small piece of gauze or a drain may have been placed in the area. That lets the cyst drain. Do not remove the gauze or the drain until your doctor tells you it is okay to do that.     Do not wear tampons. Wear feminine pads as needed for any fluid or blood.     Keep all follow-up visits as told by your doctor. This is important.    GET HELP IF:     Your pain, puffiness (swelling), or redness in the area of the cyst gets worse.     You have fluid or pus pus coming from the cyst.     You have a fever.    This information is not intended to replace advice given to you by your health care provider. Make sure you discuss any questions you have with your health care provider.    Document Released: 11/23/2008 Document Revised: 01/11/2015 Document Reviewed: 04/12/2014  Elsevier Interactive Patient Education ?2016 Elsevier Inc.      ---------------------------------------------------------------------------------------------------------------------  [name]) encourages you to self-enroll in the [facility] Patient Portal.  [facility] Patient Portal will allow you to manage your personal health information securely from your own electronic device now and in the future.  To begin your Patient Portal enrollment process, please visit [email or web address] Click on "Sign up now" under [facility].  If you find that you need additional assistance on the [facility] Patient Portal or need a copy of your medical records, please call the [facility] Medical Records Office at [phone].  Comment:

## 2020-08-15 NOTE — ED Notes (Signed)
ED Triage Note       ED Secondary Triage Entered On:  08/15/2020 13:18 EST    Performed On:  08/15/2020 13:16 EST by Roanna Banning C-RN               General Information   Barriers to Learning :   None evident   Influenza Vaccine Status :   Not received   COVID-19 Vaccine Status :   Not received   ED Home Meds Section :   Document assessment   [facility] ED Fall Risk Section :   Document assessment   ED Advance Directives Section :   Document assessment   ED Palliative Screen :   N/A (prefilled for <65yo)   Roanna Banning C-RN - 08/15/2020 13:16 EST   (As Of: 08/15/2020 13:18:02 EST)   Problems(Active)    No Chronic Problems (Cerner  :NKP )  Name of Problem:   No Chronic Problems ; Recorder:   Roanna Banning C-RN; Code:   NKP ; Last Updated:   08/15/2020 13:17 EST ; Life Cycle Date:   08/15/2020 ; Life Cycle Status:   Active ; Vocabulary:   Cerner          Diagnoses(Active)    Vaginal pain  Date:   08/15/2020 ; Diagnosis Type:   Reason For Visit ; Confirmation:   Complaint of ; Clinical Dx:   Vaginal pain ; Classification:   Medical ; Clinical Service:   Emergency medicine ; Code:   PNED ; Probability:   0 ; Diagnosis Code:   [id number]             -    Procedure History   (As Of: 08/15/2020 13:18:02 EST)     [facility]ch Fall Risk Assessment Tool   Hx of falling last 3 months ED Fall :   No   Roanna Banning C-RN - 08/15/2020 13:16 EST   ED Advance Directive   Advance Directive :   No   Roanna Banning C-RN - 08/15/2020 13:16 EST   Med Hx   Medication List   (As Of: 08/15/2020 13:18:02 EST)   No Known Home Medications     Roanna Banning C-RN - 08/15/2020 13:17:53

## 2020-08-15 NOTE — ED Notes (Signed)
ED Patient Education Note     Patient Education Materials Follows:  Obstetrics and Gynecology     Bartholin Cyst or Abscess    A Bartholin cyst is a fluid-filled sac that forms on a Bartholin gland. Bartholin glands are small glands that are found in the folds of skin (labia) on the sides of the lower opening of the vagina.    This type of cyst causes a bulge on the side of the vagina. A cyst that is not large or infected may not cause problems. However, if the fluid in the cyst becomes infected, the cyst can turn into an abscess. An abscess may cause discomfort or pain.      HOME CARE     Take medicines only as told by your doctor.     If you were prescribed an antibiotic medicine, finish all of it even if you start to feel better.     Apply warm, wet compresses to the area or take warm, shallow baths that cover your pelvic area (sitz baths). Do this many times each day or as told by your doctor.     Do not squeeze the cyst. Do not apply heavy pressure to it.     Do not have sex until the cyst has gone away.     If your cyst or abscess was opened by your doctor, a small piece of gauze or a drain may have been placed in the area. That lets the cyst drain. Do not remove the gauze or the drain until your doctor tells you it is okay to do that.     Do not wear tampons. Wear feminine pads as needed for any fluid or blood.     Keep all follow-up visits as told by your doctor. This is important.    GET HELP IF:     Your pain, puffiness (swelling), or redness in the area of the cyst gets worse.     You have fluid or pus pus coming from the cyst.     You have a fever.    This information is not intended to replace advice given to you by your health care provider. Make sure you discuss any questions you have with your health care provider.    Document Released: 11/23/2008 Document Revised: 01/11/2015 Document Reviewed: 04/12/2014  Elsevier Interactive Patient Education ?2016 Elsevier Inc.

## 2020-08-15 NOTE — Discharge Summary (Signed)
ED Clinical Summary                         [facility]  [address]  [place], [place], [address]  [phone]           PERSON INFORMATION  Name: Barbara, Hanna Age:  19 Years DOB: [birth year]   Sex: Female Language: English PCP: PCP,  NONE   Marital Status:  Single Phone: [phone] Med Service: MED-Medicine   MRN:  [id number] Acct# [id number] Arrival: 08/15/2020 12:50:00   Visit Reason: Vaginal pain; VAGINAL PAIN Acuity: 3 LOS: 000 02:03   Address:      [address] [place] [address]  Diagnosis:      Bartholin gland cyst  Printed Prescriptions:            Allergies      No Known Medication Allergies      Medications Administered During Visit:                  Medication Dose Route   acetaminophen 1000 mg Oral       Patient Medication List:              cephalexin (Keflex 500 mg oral capsule) 1 Capsules Oral (given by mouth) 4 times a day for 7 Days. Refills: 0.  naproxen (Naprosyn 500 mg oral tablet) 1 Tabs Oral (given by mouth) 2 times a day for 7 Days. Refills: 0.  sulfamethoxazole-trimethoprim (Bactrim DS 800 mg-160 mg oral tablet) 1 Tabs Oral (given by mouth) 2 times a day for 7 Days. Refills: 0.         Major Tests and Procedures:  The following procedures and tests were performed during your ED visit.  COMMONPROCEDURES%>  COMMON PROCEDURESCOMMENTS%>          Laboratory Orders  Name Status Details   .Preg U POC Completed Urine, RT, RT - Routine, Collected, 08/15/20 14:38:00 EST, Nurse collect, 08/15/20 14:38:00 [facility], RAL POC Login   .UA POC Completed Urine, RT, RT - Routine, Collected, 08/15/20 14:16:00 EST, Nurse collect, 08/15/20 14:16:00 [facility], RAL POC Login               Radiology Orders  No radiology orders were placed.              Patient Care Orders  Name Status Details   Discharge Patient Ordered 08/15/20 14:41:00 EST   ED Assessment Adult Completed 08/15/20 13:00:47 EST, 08/15/20 13:00:47 EST   ED Secondary Triage Completed 08/15/20  13:00:47 EST, 08/15/20 13:00:47 EST   ED Triage Adult Completed 08/15/20 12:50:33 EST, 08/15/20 12:50:33 EST   POC-Urine Dipstick collect Completed 08/15/20 13:15:00 EST, Once, 08/15/20 13:15:00 EST   POC-Urine Pregnancy Test collect Completed 08/15/20 13:15:00 EST, Once, 08/15/20 13:15:00 EST             PROVIDER INFORMATION               Provider Role Assigned Shelton, Natalia Leatherwood E-PA ED MidLevel 08/15/2020 13:15:24    Roanna Banning C-RN ED Nurse 08/15/2020 13:20:08        Attending Physician:  Annye Asa E-PA     Admit Doc  MCNEILL,  KATHERINE E-PA     Consulting Doc       VITALS INFORMATION  Vital Sign Triage Latest   Temp Oral ORAL_1%>36.8 degC ORAL%>36.8 degC   Temp Temporal TEMPORAL_1%> TEMPORAL%>   Temp Intravascular  INTRAVASCULAR_1%> INTRAVASCULAR%>   Temp Axillary AXILLARY_1%> AXILLARY%>   Temp Rectal RECTAL_1%> RECTAL%>   02 Sat 98 % 98 %   Respiratory Rate RATE_1%>18 br/min RATE%>18 br/min   Peripheral Pulse Rate PULSE RATE_1%> PULSE RATE%>   Apical Heart Rate HEART RATE_1%> HEART RATE%>   Blood Pressure BLOOD PRESSURE_1%>/ BLOOD PRESSURE_1%>81 mmHg BLOOD PRESSURE%>120 mmHg / BLOOD PRESSURE%>78 mmHg                 Immunizations      No Immunizations Documented This Visit          DISCHARGE INFORMATION   Discharge Disposition: H Outpt-Sent Home   Discharge Location:    Home   Discharge Date and Time:    08/15/2020 14:53:25   ED Checkout Date and Time:    08/15/2020 14:53:25     DEPART REASON INCOMPLETE INFORMATION               Depart Action Incomplete Reason   Interactive View/I&O Recently assessed               Problems      Active           No Chronic Problems              Smoking Status      No Smoking Status Documented         PATIENT EDUCATION INFORMATION  Instructions:       Bartholin Cyst or Abscess, Easy-to-Read     Follow up:                    With: Address: When:   Follow-up with your OB GYN Dr. Bradly ChrisStroud tomorrow as scheduled  In 1 day   Comments:   You can take the medications  I have given you for pain. Take the antibiotics I have given you until completion as directed. Tylenol for added relief as well as warm compress as we discussed. Return to the emergency department for any new or changing symptoms overnight.           ED PROVIDER DOCUMENTATION     Patient:   Barbara Hanna, Barbara Hanna             MRN: [id number]            FIN: [id number]               Age:   [age] years     Sex:  Female     DOB:  03/31/2001   Associated Diagnoses:   Bartholin gland cyst   Author:   Annye AsaMCNEILL,  KATHERINE E-PA      Basic Information   Time seen: Provider Seen (ST)   ED Provider/Time:    MCNEILL,  KATHERINE E-PA / 08/15/2020 13:15  .   Additional information: Chief Complaint from Nursing Triage Note   Chief Complaint  Chief Complaint: vaginal pain states there is a knot down there, its not like in gorwn hair more like inside cyst, hard to sit down, waked in no distress (08/15/20 12:57:00).      History of Present Illness   19 year old otherwise healthy female presents to the emergency department for fullness and pain to the left labia gradually increasing over the last few days.  No previous episodes.  No injury.  Denies any drainage or bleeding, pelvic pain, chance of pregnancy, fevers or chills.  She has an appointment scheduled with her OB/GYN Dr. [facility]oud [facility] tomorrow for evaluation  though she comes here today for discomfort.  Symptoms worsen with sitting upright and with walking.  No therapy prior to arrival..        Review of Systems             Additional review of systems information: All other systems reviewed and otherwise negative.      Health Status   Allergies:    Allergic Reactions (Selected)  No Known Medication Allergies.   Medications:  (Selected)   Inpatient Medications  Ordered  Tylenol: 1,000 mg, 2 tabs, Oral, Once.      Past Medical/ Family/ Social History   Problem list:    Active Problems (1)  No Chronic Problems   .      Physical Examination               Vital Signs   Vital  Signs   08/15/2020 13:18 EST Respiratory Rate 20 br/min   08/15/2020 12:57 EST Systolic Blood Pressure 131 mmHg    Diastolic Blood Pressure 81 mmHg    Temperature Oral 36.8 degC    Heart Rate Monitored 78 bpm    Respiratory Rate 18 br/min    SpO2 98 %   .   Measurements   08/15/2020 13:00 EST Body Mass Index est meas 33.35 kg/m2   08/15/2020 13:00 EST Body Mass Index Measured 33.35 kg/m2   08/15/2020 12:57 EST Height/Length Measured 163 cm    Weight Dosing 88.6 kg   .   Basic Oxygen Information   08/15/2020 12:57 EST Oxygen Therapy Room air    SpO2 98 %   .   General:  Alert, no acute distress, WDWN, talking while sitting up in bed. .    Skin:  Warm, dry, intact, no rash.    Head:  Normocephalic, atraumatic.    Neck:  Supple, trachea midline.    Eye:  Vision unchanged.   Ears, nose, mouth and throat:  Oral mucosa moist.   Cardiovascular:  Regular rate and rhythm, Normal peripheral perfusion, No edema.    Respiratory:  Lungs are clear to auscultation, respirations are non-labored, breath sounds are equal.    Chest wall:  No deformity.   Back:  Normal range of motion.   Musculoskeletal:  Normal ROM, normal strength.    Gastrointestinal:  Soft, Nontender, Non distended.    Genitourinary:  External genitalia overall unremarkable.  The left labia majora on palpation does reveal a fullness with mild tenderness consistent with a Bartholin cyst versus abscess.  No appreciable warmth.  No drainage can be expressed.  ED Technician Corrie Dandy was present as chaperone for exam..   Neurological:  Alert and oriented to person, place, time, and situation, normal motor observed, normal speech observed.    Psychiatric:  Cooperative, appropriate mood & affect, normal judgment.       Medical Decision Making   Rationale:  PA/NP reviewed with co-signing physician: diagnosis and plan of care.   Documents reviewed:  Emergency department nurses' notes, prior records.    Results review:  Lab results : Lab View   08/15/2020 14:38 EST Preg U POC Negative    08/15/2020 14:16 EST Appear U POC Clear    Color U POC Yellow    Bili U POC Negative    Blood U POC Large    Glucose U POC Negative mg/dL    Ketones U POC Negative mg/dL    Leuk Est U POC Trace    Nitrite U POC Negative    pH  U POC 6.0    Protein U POC Trace    Spec Grav U POC >=1.030    Urobilin U POC 0.2 EU/dL   .   Notes:  19 year old female with history and physical exam consistent with Bartholin gland cyst versus abscess.  I discussed with the patient the diagnosis and management including antibiotics and I&D.  Patient at this time would like to forego incision and drainage as she has an appointment scheduled with her OB/GYN tomorrow and would like to trial with warm compress and antibiotics overnight pending outpatient management more definitively with her OB/GYN.  I do believe this is reasonable as she has normal vital signs here and appears comfortable.  She will receive her first dose of antibiotics here in the emergency department before discharge home and outpatient follow-up.  Discussed return precautions as well as symptomatic management, pelvic rest.  The patient understands and consents..      Impression and Plan   Diagnosis   Bartholin gland cyst (ICD10-CM N75.0, Discharge, Medical)   Plan   Condition: Stable.    Disposition: Medically cleared, Discharged: Time  08/15/2020 14:40:00, to home.    Prescriptions: Launch prescriptions   Pharmacy:  Naprosyn 500 mg oral tablet (Prescribe): 500 mg, 1 tabs, Oral, BID, for 7 days, 14 tabs, 0 Refill(s)  Keflex 500 mg oral capsule (Prescribe): 500 mg, 1 caps, Oral, QID, for 7 days, 28 caps, 0 Refill(s)  Bactrim DS 800 mg-160 mg oral tablet (Prescribe): 1 tabs, Oral, BID, for 7 days, 14 tabs, 0 Refill(s).    Patient was given the following educational materials: Bartholin Cyst or Abscess, Easy-to-Read.    Follow up with: Follow-up with your OB GYN Dr. Bradly Chris tomorrow as scheduled In 1 day You can take the medications I have given you for pain.  Take the  antibiotics I have given you until completion as directed.  Tylenol for added relief as well as warm compress as we discussed.  Return to the emergency department for any new or changing symptoms overnight..    Counseled: I had a detailed discussion with the patient and/or guardian regarding the historical points/exam findings supporting the discharge diagnosis and need for outpatient followup. Discussed the need to return to the ER if symptoms persist/worsen, or for any questions/concerns that arise at home.

## 2020-08-15 NOTE — ED Provider Notes (Signed)
Genitourinary Problem *ED        Patient:   Barbara Hanna, Barbara Hanna             MRN: [id number]            FIN: [id number]               Age:   19 years     Sex:  Female     DOB:  2001-03-10   Associated Diagnoses:   Bartholin gland cyst   Author:   Annye Asa E-PA      Basic Information   Time seen: Provider Seen (ST)   ED Provider/Time:    MCNEILL,  Chloie Loney E-PA / 08/15/2020 13:15  .   Additional information: Chief Complaint from Nursing Triage Note   Chief Complaint  Chief Complaint: vaginal pain states there is a knot down there, its not like in gorwn hair more like inside cyst, hard to sit down, waked in no distress (08/15/20 12:57:00).      History of Present Illness   19 year old otherwise healthy female presents to the emergency department for fullness and pain to the left labia gradually increasing over the last few days.  No previous episodes.  No injury.  Denies any drainage or bleeding, pelvic pain, chance of pregnancy, fevers or chills.  She has an appointment scheduled with her OB/GYN Dr. Bradly Chris at [facility] tomorrow for evaluation though she comes here today for discomfort.  Symptoms worsen with sitting upright and with walking.  No therapy prior to arrival..        Review of Systems             Additional review of systems information: All other systems reviewed and otherwise negative.      Health Status   Allergies:    Allergic Reactions (Selected)  No Known Medication Allergies.   Medications:  (Selected)   Inpatient Medications  Ordered  Tylenol: 1,000 mg, 2 tabs, Oral, Once.      Past Medical/ Family/ Social History   Problem list:    Active Problems (1)  No Chronic Problems   .      Physical Examination               Vital Signs   Vital Signs   08/15/2020 13:18 EST Respiratory Rate 20 br/min   08/15/2020 12:57 EST Systolic Blood Pressure 131 mmHg    Diastolic Blood Pressure 81 mmHg    Temperature Oral 36.8 degC    Heart Rate Monitored 78 bpm    Respiratory Rate 18 br/min    SpO2 98 %    .   Measurements   08/15/2020 13:00 EST Body Mass Index est meas 33.35 kg/m2   08/15/2020 13:00 EST Body Mass Index Measured 33.35 kg/m2   08/15/2020 12:57 EST Height/Length Measured 163 cm    Weight Dosing 88.6 kg   .   Basic Oxygen Information   08/15/2020 12:57 EST Oxygen Therapy Room air    SpO2 98 %   .   General:  Alert, no acute distress, WDWN, talking while sitting up in bed. .    Skin:  Warm, dry, intact, no rash.    Head:  Normocephalic, atraumatic.    Neck:  Supple, trachea midline.    Eye:  Vision unchanged.   Ears, nose, mouth and throat:  Oral mucosa moist.   Cardiovascular:  Regular rate and rhythm, Normal peripheral perfusion, No edema.    Respiratory:  Lungs are clear  to auscultation, respirations are non-labored, breath sounds are equal.    Chest wall:  No deformity.   Back:  Normal range of motion.   Musculoskeletal:  Normal ROM, normal strength.    Gastrointestinal:  Soft, Nontender, Non distended.    Genitourinary:  External genitalia overall unremarkable.  The left labia majora on palpation does reveal a fullness with mild tenderness consistent with a Bartholin cyst versus abscess.  No appreciable warmth.  No drainage can be expressed.  ED Technician Corrie Dandy was present as chaperone for exam..   Neurological:  Alert and oriented to person, place, time, and situation, normal motor observed, normal speech observed.    Psychiatric:  Cooperative, appropriate mood & affect, normal judgment.       Medical Decision Making   Rationale:  PA/NP reviewed with co-signing physician: diagnosis and plan of care.   Documents reviewed:  Emergency department nurses' notes, prior records.    Results review:  Lab results : Lab View   08/15/2020 14:38 EST Preg U POC Negative   08/15/2020 14:16 EST Appear U POC Clear    Color U POC Yellow    Bili U POC Negative    Blood U POC Large    Glucose U POC Negative mg/dL    Ketones U POC Negative mg/dL    Leuk Est U POC Trace    Nitrite U POC Negative    pH U POC 6.0    Protein U  POC Trace    Spec Grav U POC >=1.030    Urobilin U POC 0.2 EU/dL   .   Notes:  19 year old female with history and physical exam consistent with Bartholin gland cyst versus abscess.  I discussed with the patient the diagnosis and management including antibiotics and I&D.  Patient at this time would like to forego incision and drainage as she has an appointment scheduled with her OB/GYN tomorrow and would like to trial with warm compress and antibiotics overnight pending outpatient management more definitively with her OB/GYN.  I do believe this is reasonable as she has normal vital signs here and appears comfortable.  She will receive her first dose of antibiotics here in the emergency department before discharge home and outpatient follow-up.  Discussed return precautions as well as symptomatic management, pelvic rest.  The patient understands and consents..      Impression and Plan   Diagnosis   Bartholin gland cyst (ICD10-CM N75.0, Discharge, Medical)   Plan   Condition: Stable.    Disposition: Medically cleared, Discharged: Time  08/15/2020 14:40:00, to home.    Prescriptions: [facility] prescriptions   Pharmacy:  Naprosyn 500 mg oral tablet (Prescribe): 500 mg, 1 tabs, Oral, BID, for 7 days, 14 tabs, 0 Refill(s)  Keflex 500 mg oral capsule (Prescribe): 500 mg, 1 caps, Oral, QID, for 7 days, 28 caps, 0 Refill(s)  Bactrim DS 800 mg-160 mg oral tablet (Prescribe): 1 tabs, Oral, BID, for 7 days, 14 tabs, 0 Refill(s).    Patient was given the following educational materials: Bartholin Cyst or Abscess, Easy-to-Read.    Follow up with: Follow-up with your OB GYN Dr. Bradly Chris tomorrow as scheduled In 1 day You can take the medications I have given you for pain.  Take the antibiotics I have given you until completion as directed.  Tylenol for added relief as well as warm compress as we discussed.  Return to the emergency department for any new or changing symptoms overnight..    Counseled: I had a detailed discussion  with the  patient and/or guardian regarding the historical points/exam findings supporting the discharge diagnosis and need for outpatient followup. Discussed the need to return to the ER if symptoms persist/worsen, or for any questions/concerns that arise at home.    Signature Line     Electronically Signed on 08/15/2020 02:41 PM EST   ________________________________________________   Annye Asa E-PA      Electronically Signed on 08/21/2020 10:22 PM EST   ________________________________________________   Vangie Bicker            Modified by: Annye Asa E-PA on 08/15/2020 02:41 PM EST

## 2020-08-16 IMAGING — US US OB LIMITED
1 series · 14 of 28 positions shown · non-contrast
Comparison: none

CLINICAL DATA: Vaginal bleeding.  Pregnancy.

EXAM:
LIMITED OBSTETRIC ULTRASOUND

[Series 1: us ob limited · 0.23mm/px · 14 of 38 slices shown]
[im 2/38]
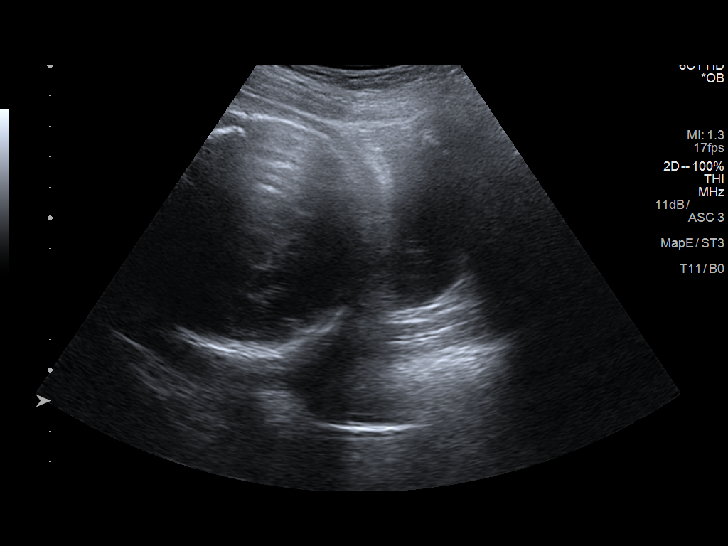
[im 5/38]
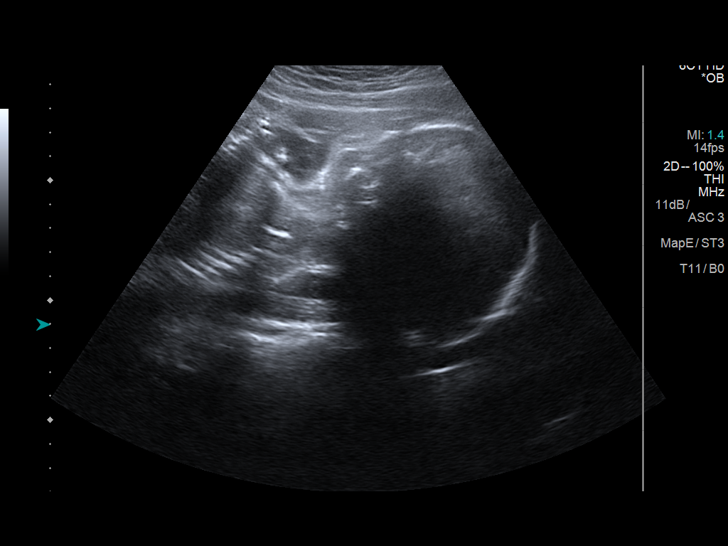
[im 7/38]
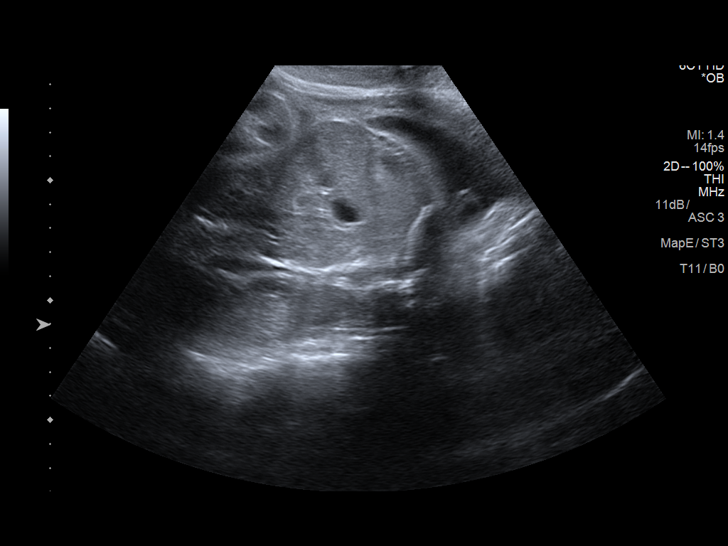
[im 10/38]
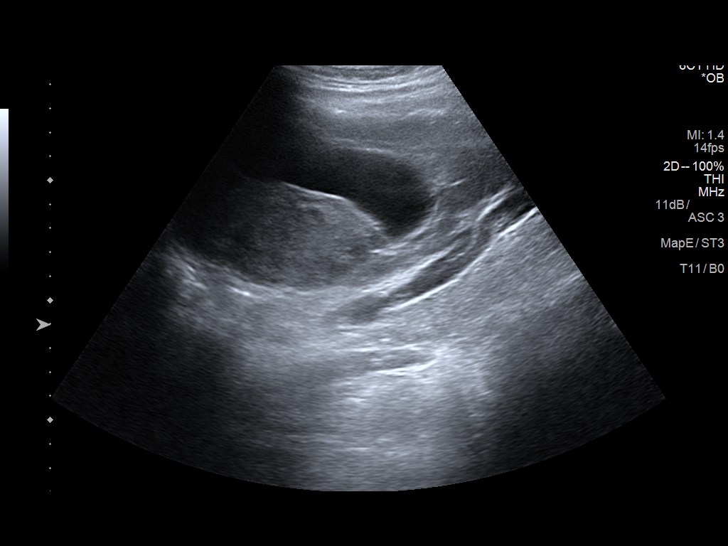
[im 13/38]
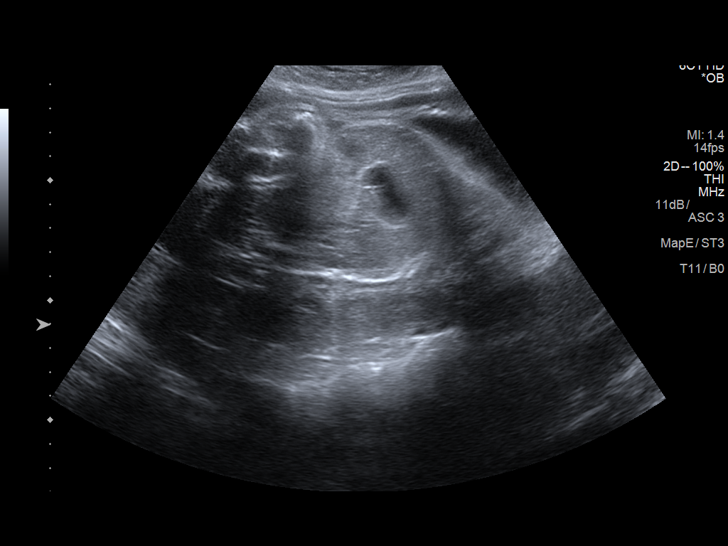
[im 16/38]
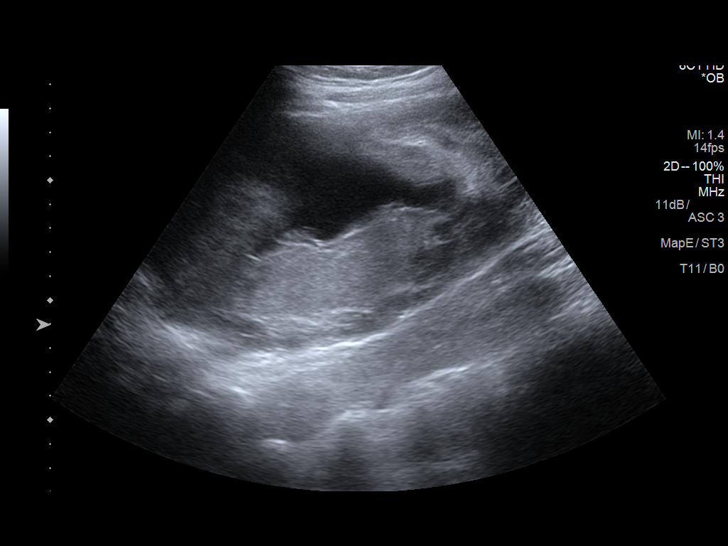
[im 18/38]
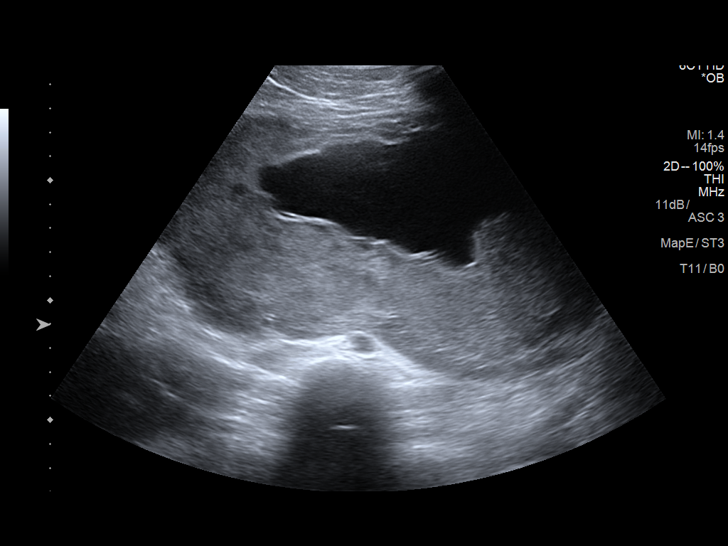
[im 21/38]
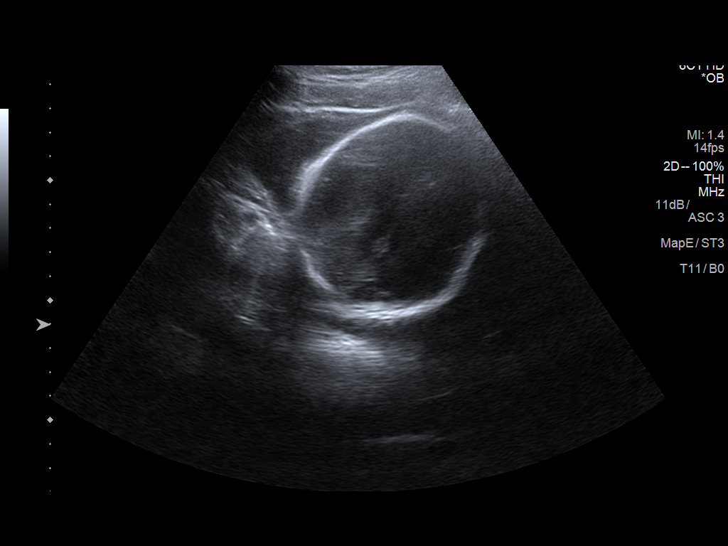
[im 24/38]
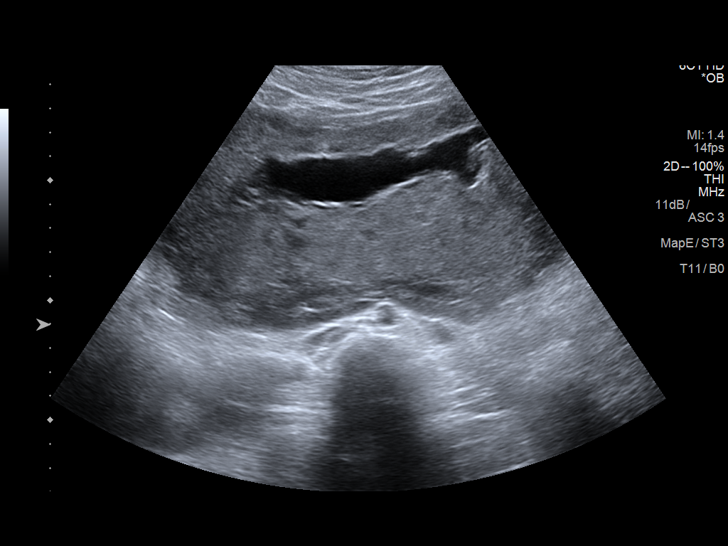
[im 27/38]
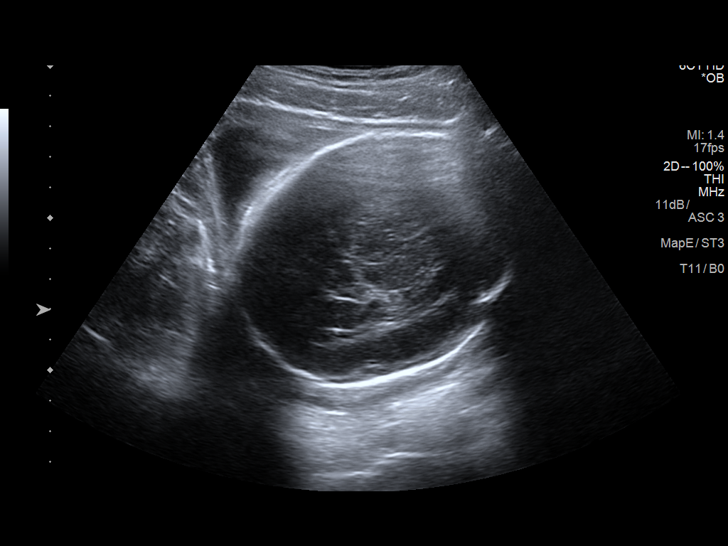
[im 29/38]
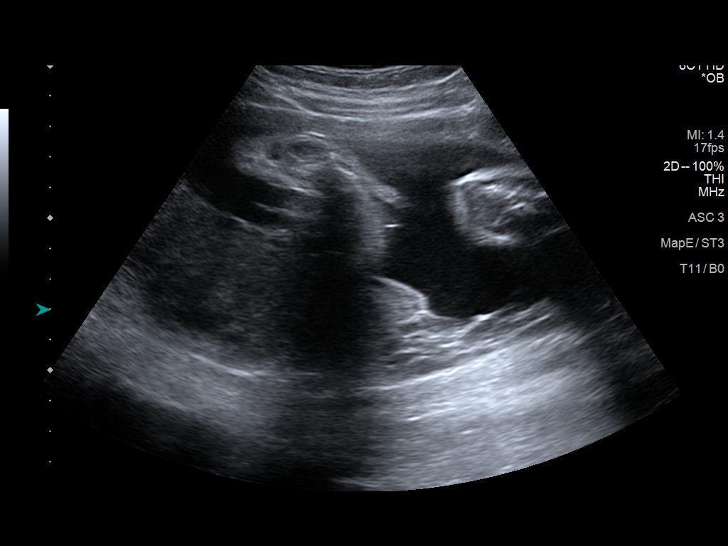
[im 32/38]
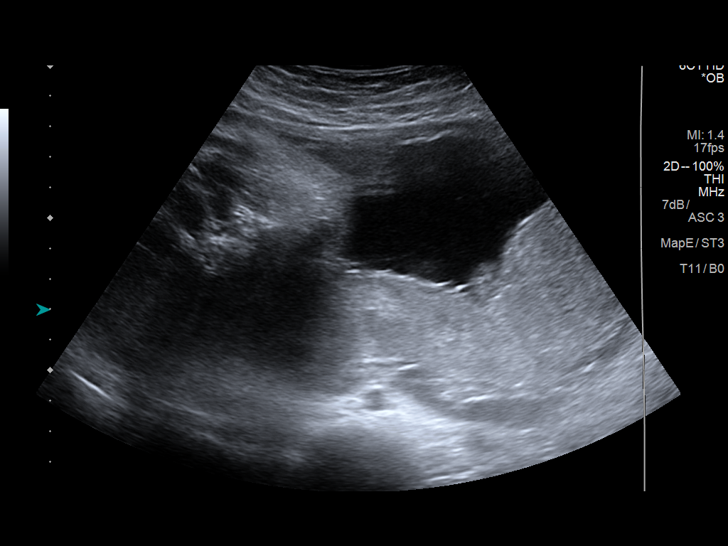
[im 35/38]
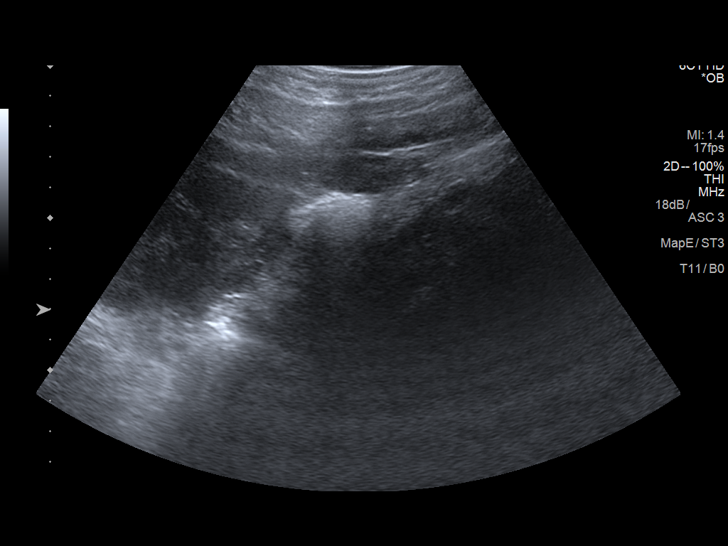
[im 38/38]
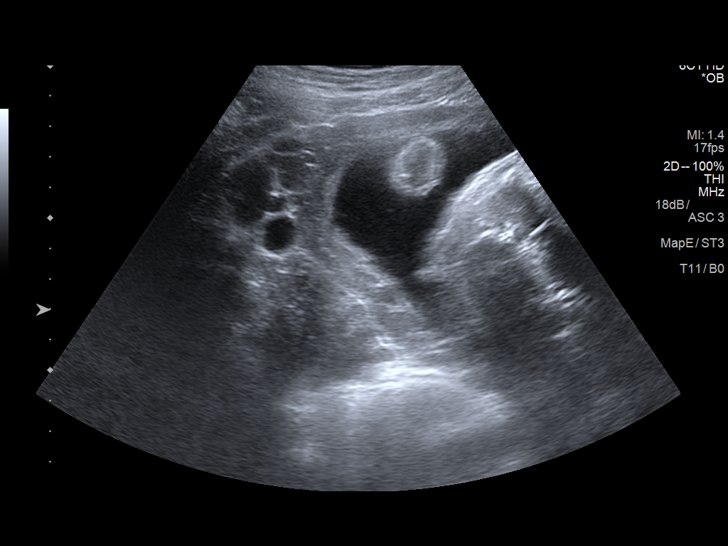

[14 of 28 positions shown; findings below may reference images not displayed]

FINDINGS: Number of Fetuses: 1

Heart Rate:  144 bpm

Movement: No

Presentation: Cephalic

Placental Location: Fundal

Previa: No

Amniotic Fluid (Subjective):  Possibly diminished.

BPD: 7.8 cm cm 31 weeks 3 days.

MATERNAL FINDINGS:

Cervix:  Appears closed.

Uterus/Adnexae: No abnormality visualized.
IMPRESSION: Single viable intrauterine pregnancy at 31 weeks 3 days. Fetal heart
rate 144 beats per minute. No fetal movement noted.

This exam is performed on an emergent basis and does not
comprehensively evaluate fetal size, dating, or anatomy; follow-up
complete OB US should be considered if further fetal assessment is
warranted.

## 2020-08-16 IMAGING — US US RENAL
1 series · 14 of 25 positions shown · non-contrast
Comparison: None.

CLINICAL DATA: Hematuria with flank pain

EXAM:
RENAL / URINARY TRACT ULTRASOUND COMPLETE

[Series 1: us renal · 0.23mm/px · 14 of 29 slices shown]
[im 1/29]
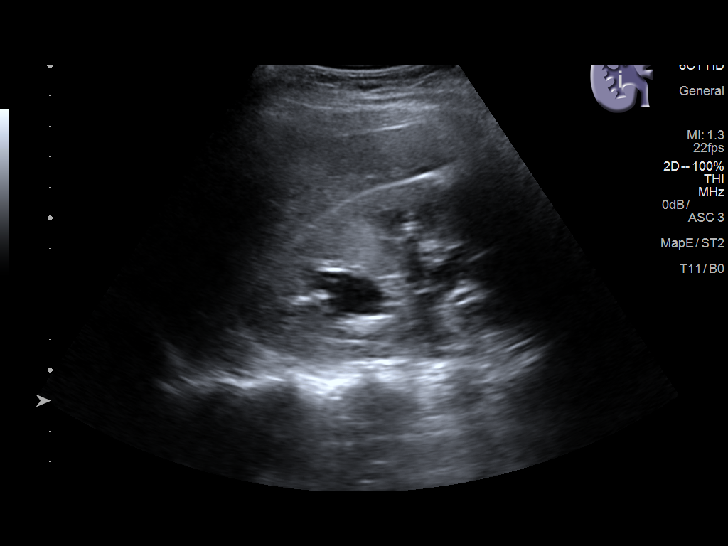
[im 3/29]
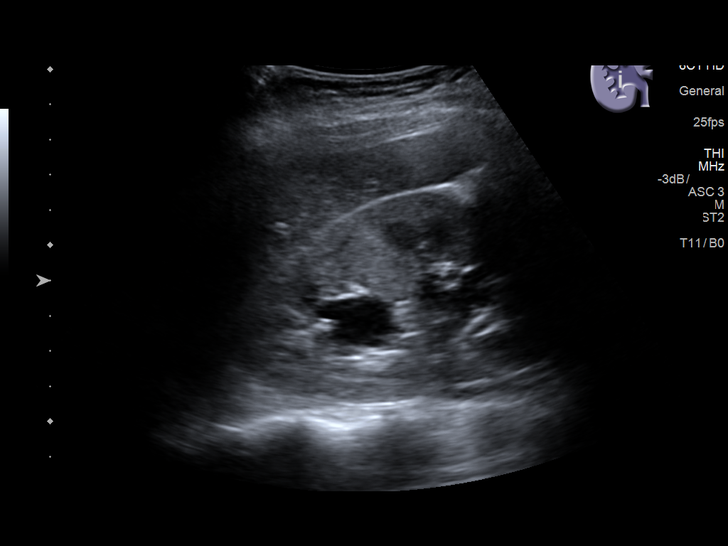
[im 5/29]
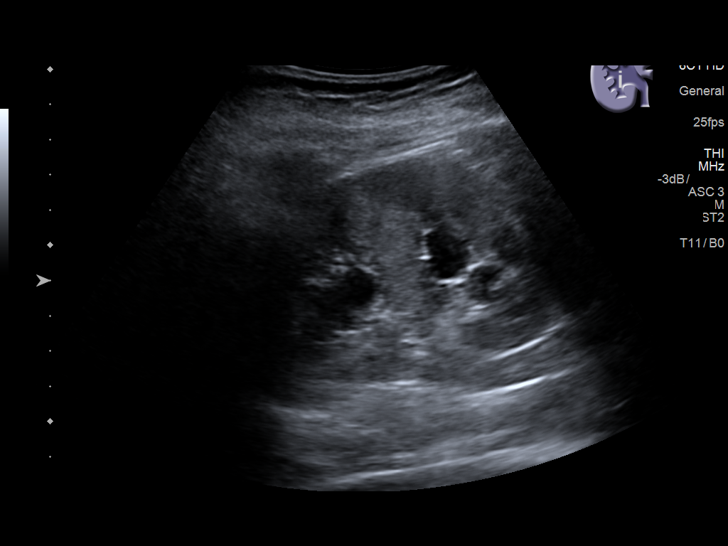
[im 8/29]
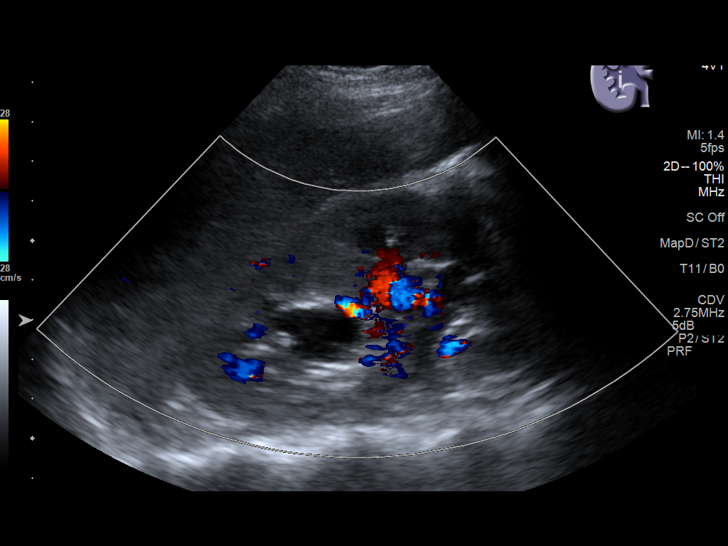
[im 10/29]
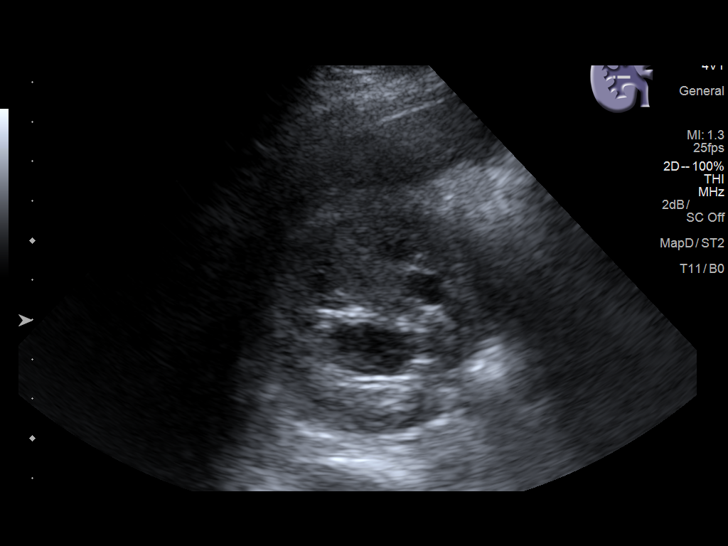
[im 11/29]
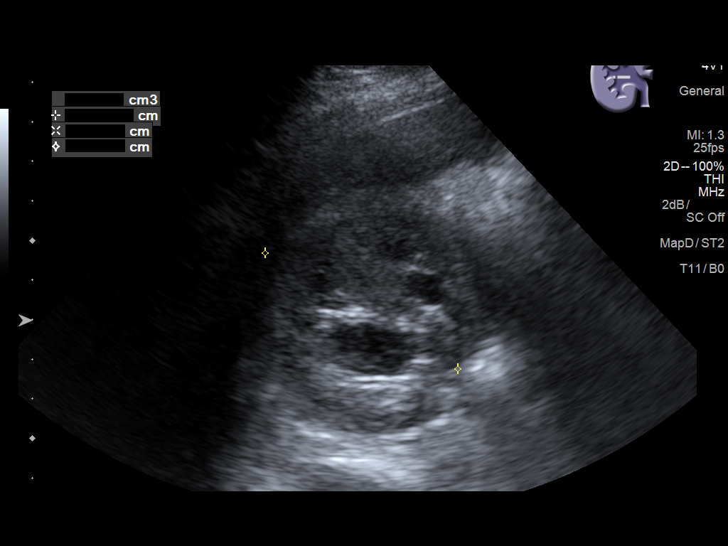
[im 13/29]
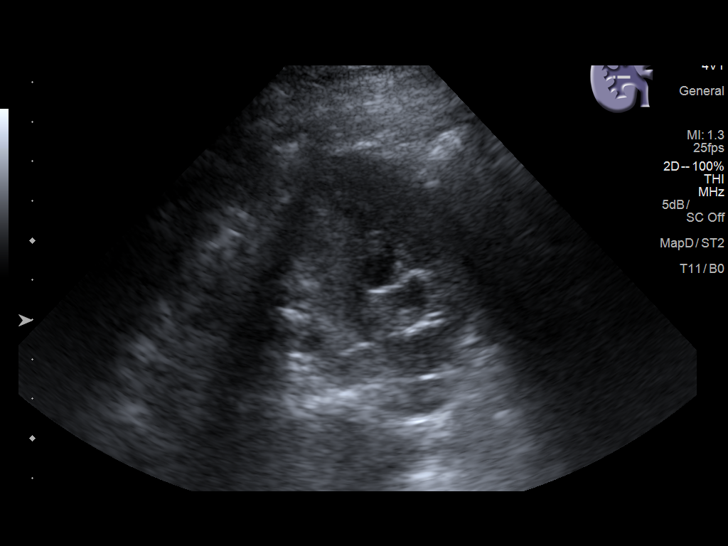
[im 16/29]
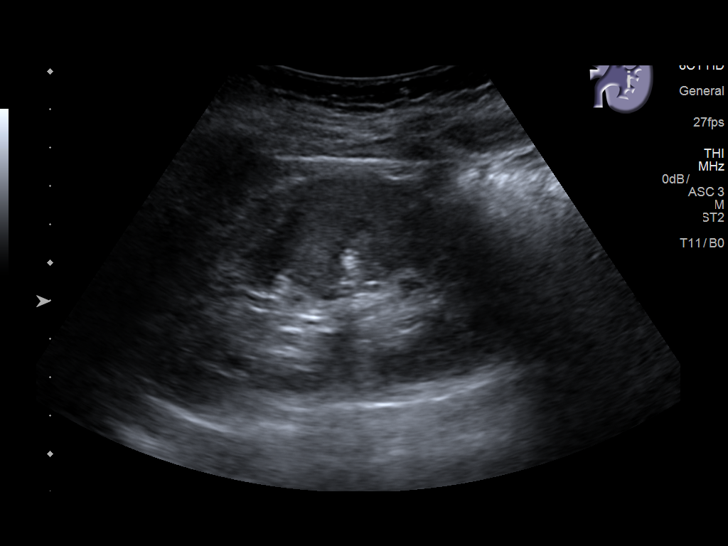
[im 18/29]
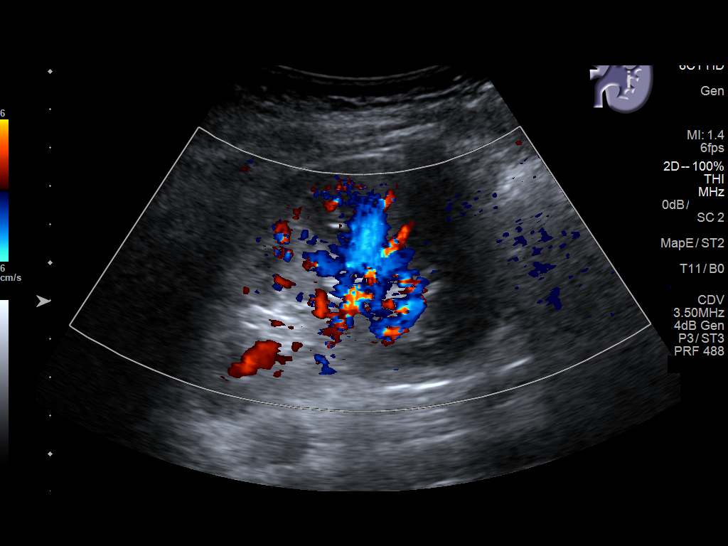
[im 19/29]
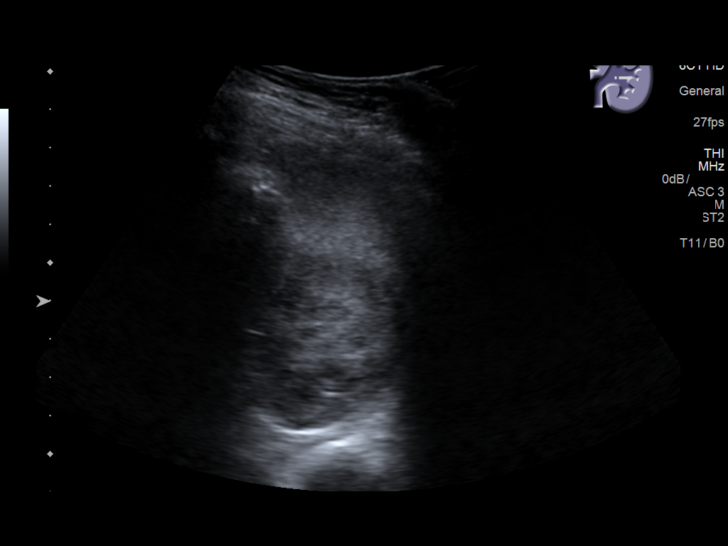
[im 22/29]
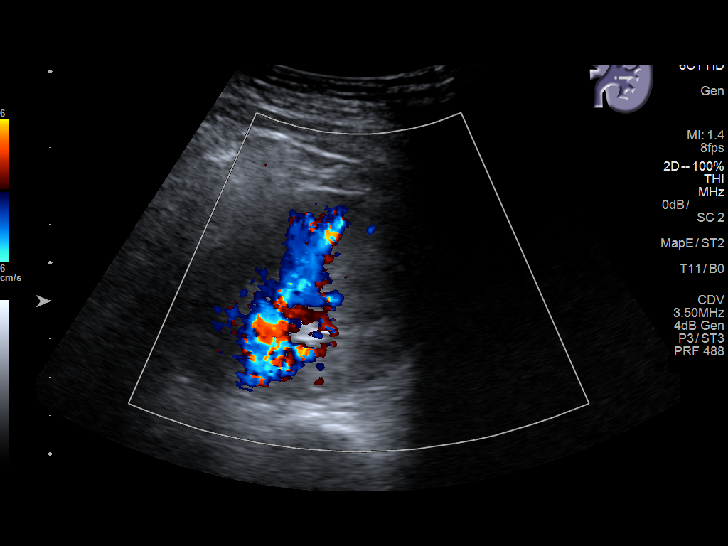
[im 24/29]
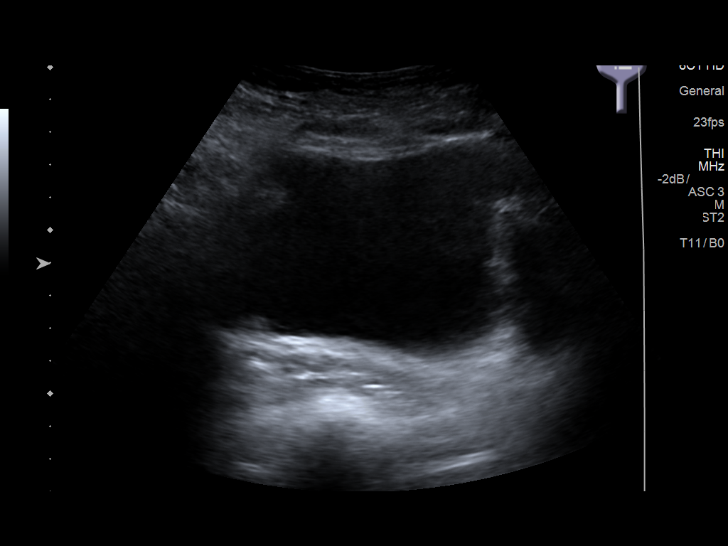
[im 26/29]
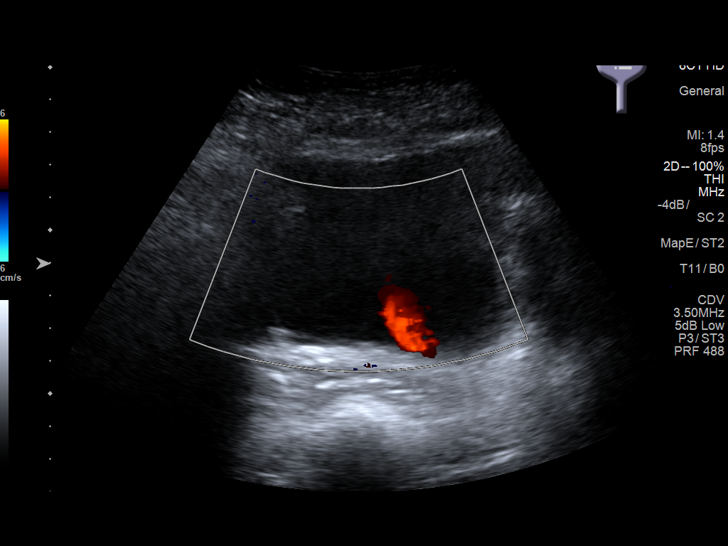
[im 29/29]
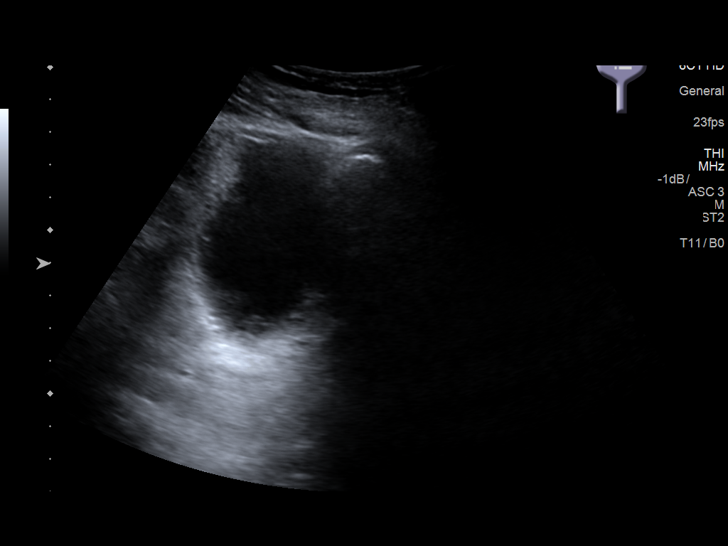

[14 of 25 positions shown; findings below may reference images not displayed]

FINDINGS: Right Kidney:

Renal measurements: 10.2 x 5.8 x 5.7 cm = volume: 175.9 mL .
Echogenicity and renal cortical thickness are within normal limits.
No mass or perinephric fluid visualized. There is moderate
hydronephrosis on the right. No sonographically demonstrable
calculus or ureterectasis.

Left Kidney:

Renal measurements: 10.8 x 5.9 x 5.0 cm = volume: 151.8 mL.
Echogenicity and renal cortical thickness are within normal limits.
No mass, perinephric fluid, or hydronephrosis visualized. No
sonographically demonstrable calculus or ureterectasis.

Bladder:

Appears normal for degree of bladder distention. Flow from each
distal ureter is seen in the bladder.
IMPRESSION: Moderate hydronephrosis on the right without focal site of
obstruction noted. Study otherwise unremarkable.

## 2020-12-15 LAB — POC PREGNANCY UR-QUAL: Preg Test, Ur: NEGATIVE

## 2020-12-15 NOTE — ED Notes (Signed)
ED Triage Note       ED Secondary Triage Entered On:  12/15/2020 12:20 EDT    Performed On:  12/15/2020 12:20 EDT by Michiel Sites               General Information   Barriers to Learning :   None evident   Pt. Currently Receiving Radiation :   No   COVID-19 Vaccine Status :   Not received   ED Home Meds Section :   Document assessment   [facility] ED Fall Risk Section :   Document assessment   ED History Section :   Document assessment   ED Advance Directives Section :   Document assessment   ED Palliative Screen :   N/A (prefilled for <65yo)   [place],  Julianna-RN - 12/15/2020 12:20 EDT   (As Of: 12/15/2020 12:20:52 EDT)   Problems(Active)    No Chronic Problems ([facility]  :NKP )  Name of Problem:   No Chronic Problems ; Recorder:   Roanna Banning C-RN; Code:   NKP ; Last Updated:   08/15/2020 13:17 EST ; Life Cycle Date:   08/15/2020 ; Life Cycle Status:   Active ; Vocabulary:   [facility]          Diagnoses(Active)    Vaginal swelling  Date:   12/15/2020 ; Diagnosis Type:   Reason For Visit ; Confirmation:   Complaint of ; Clinical Dx:   Vaginal swelling ; Classification:   Medical ; Clinical Service:   Emergency medicine ; Code:   PNED ; Probability:   0 ; Diagnosis Code:   [id number]             -    Procedure History   (As Of: 12/15/2020 12:20:52 EDT)     Phoebe Perch Fall Risk Assessment Tool   Hx of falling last 3 months ED Fall :   No   Patient confused or disoriented ED Fall :   No   Patient intoxicated or sedated ED Fall :   No   Patient impaired gait ED Fall :   No   Use a mobility assistance device ED Fall :   No   Patient altered elimination ED Fall :   No   [facility] ED Fall Score :   0    Michiel Sites - 12/15/2020 12:20 EDT   ED Advance Directive   Advance Directive :   No   Michiel Sites - 12/15/2020 12:20 EDT   Social History   Social History   (As Of: 12/15/2020 12:20:52 EDT)   Tobacco:        Tobacco use: Never (less than 100 in lifetime).   (Last Updated: 12/15/2020 12:20:30 EDT  by Michiel Sites)          Alcohol:        Denies   (Last Updated: 12/15/2020 12:20:34 EDT by Michiel Sites)            Med Hx   Medication List   (As Of: 12/15/2020 12:20:52 EDT)

## 2020-12-15 NOTE — ED Notes (Signed)
 ED Patient Summary                 [facility]  [address] [place], [place] [address]  [phone]  Discharge Instructions (Patient)  Barbara Hanna, Barbara Hanna  DOB:  04/10/2001                   MRN: [id number]                   FIN: [id number]  Reason For Visit: Vaginal swelling; VAGINAL SWELLING  Final Diagnosis: Bartholin gland cyst     Visit Date: 12/15/2020 11:08:00  Address: [address] [place] [address]  Phone: [phone]     Emergency Department Providers:         Primary Physician:      SMITTY NORLEEN Barbara Hanna would like to thank you for allowing us  to assist you with your healthcare needs. The following includes patient education materials and information regarding your injury/illness.     Follow-up Instructions:  You were seen today on an emergency basis. Please contact your primary care doctor for a follow up appointment. If you received a referral to a specialist doctor, it is important you follow-up as instructed.    It is important that you call your follow-up doctor to schedule and confirm the location of your next appointment. Your doctor may practice at multiple locations. The office location of your follow-up appointment may be different to the one written on your discharge instructions.    If you do not have a primary care doctor, please call [phone] for help in finding a [facility] H[facility]. [name]. For help in finding a specialist doctor, please call [phone].    If your condition gets worse before your follow-up with your primary care doctor or specialist, please return to the Emergency Department.      Coronavirus 2019 (COVID-19) Reminders:     Patients age 2 - 87, with parental consent, and over age 34 can make an appointment for a COVID-19 vaccine. Patients can contact their [facility] doctors' offices to schedule an appointment to receive the COVID-19 vaccine. Patients who do not  have a Barbara Hanna physician can call [phone] to schedule vaccination appointments.      Follow Up Appointments:  Primary Care Provider:     Name: PCP,  NONE     Phone:                  With: Address: When:   Please come back to the emergency department if your current symptoms worsen, if you develop concerning new symptoms, or for any medical emergency.         With: Address: When:   [facility] [address] Barbara Hanna [place], [place] [address]  [phone] Business (1) Within 1 week              Post [facility] SERVICES%>             New Medications  Printed Prescriptions  doxycycline (doxycycline monohydrate 100 mg oral capsule) 1 Capsules Oral (given by mouth) 2 times a day for 10 Days. Refills: 0.  Last Dose:____________________      Allergy Info: No Known Medication Allergies     Discharge Additional Information          Discharge Patient 12/15/20 16:13:00 EDT      Patient  Education Materials:              Moderate Conscious Sedation, Adult, Care After  Refer to this sheet in the next few weeks. These instructions provide you with information on caring for yourself after your procedure. Your health care provider may also give you more specific instructions. Your treatment has been planned according to current medical practices, but problems sometimes occur. Call your health care provider if you have any problems or questions after your procedure.  WHAT TO EXPECT AFTER THE PROCEDURE  After your procedure:  . You may feel sleepy, clumsy, and have poor balance for several hours.   . Vomiting may occur if you eat too soon after the procedure.   HOME CARE INSTRUCTIONS  . Do not participate in any activities where you could become injured for at least 24 hours. Do not:  ? Drive.  ? Swim.  ? Ride a bicycle.  ? Operate heavy machinery.  ? Cook.  ? Use power tools.  ? Climb ladders.  ? Work from a high place.  . Do not make important decisions or sign legal documents until you are  improved.  . If you vomit, drink water, juice, or soup when you can drink without vomiting. Make sure you have little or no nausea before eating solid foods.  . Only take over-the-counter or prescription medicines for pain, discomfort, or fever as directed by your health care provider.  . Make sure you and your family fully understand everything about the medicines given to you, including what side effects may occur.  . You should not drink alcohol, take sleeping pills, or take medicines that cause drowsiness for at least 24 hours.  . If you smoke, do not smoke without supervision.  . If you are feeling better, you may resume normal activities 24 hours after you were sedated.  Barbara Hanna Keep all appointments with your health care provider.  SEEK MEDICAL CARE IF:  . Your skin is pale or bluish in color.  . You continue to feel nauseous or vomit.  . Your pain is getting worse and is not helped by medicine.  . You have bleeding or swelling.  . You are still sleepy or feeling clumsy after 24 hours.  SEEK IMMEDIATE MEDICAL CARE IF:  . You develop a rash.  . You have difficulty breathing.  . You develop any type of allergic problem.  . You have a fever.  MAKE SURE YOU:  . Understand these instructions.  . Will watch your condition.  . Will get help right away if you are not doing well or get worse.  This information is not intended to replace advice given to you by your health care provider. Make sure you discuss any questions you have with your health care provider.  Document Released: 06/17/2013 Document Revised: 09/17/2014 Document Reviewed: 06/17/2013  Elsevier Interactive Patient Education 2016 Elsevier Inc.        ---------------------------------------------------------------------------------------------------------------------  [facility] allows patients to review your COVID and other test results as well as discharge documents from any [facility]. [facility], Emergency Department, surgical center  or outpatient lab. Test results are typically available 36 hours after the test is completed.     [facility] encourages you to self-enroll in the [facility] Patient Portal.     To begin your self-enrollment process, please visit [email or web address] Under [facility], click on "Sign up now".     NOTE: You must be 16 years and older  to use [facility] Self-Enroll online. If you are a parent, caregiver, or guardian; you need an invite to access your child's or dependent's health records. To obtain an invite, contact the Medical Records department at [phone] Monday through Friday, 8-4:30, select option 3 . If we receive your call afterhours, we will return your call the next business day.     If you have issues trying to create or access your account, contact [facility] support at [phone] available 7 days a week 24 hours a day.     Comment:

## 2020-12-15 NOTE — ED Notes (Signed)
ED Triage Note       ED Triage Adult Entered On:  12/15/2020 11:15 EDT    Performed On:  12/15/2020 11:13 EDT by Marcelle Smiling, RN, Ralph               Triage   Numeric Rating Pain Scale :   10 = Worst possible pain   Chief Complaint :   Pt c/o vaginal swelling, Pt dneis any urinary pain, discharge or bleeding.   [place] Mode of Arrival :   Private vehicle   Infectious Disease Documentation :   Document assessment   Patient received chemo or biotherapy last 48 hrs? :   No   Temperature Oral :   36.9 degC(Converted to: 98.4 degF)    Heart Rate Monitored :   88 bpm   Respiratory Rate :   16 br/min   Systolic Blood Pressure :   144 mmHg (HI)    Diastolic Blood Pressure :   107 mmHg (>HHI)    SpO2 :   98 %   Oxygen Therapy :   Room air   Patient presentation :   None of the above   Chief Complaint or Presentation suggest infection :   No   Weight Dosing :   88 kg(Converted to: 194 lb 0 oz)    Height :   165 cm(Converted to: 5 ft 5 in)    Body Mass Index Dosing :   32 kg/m2   Johnnye Sima - 12/15/2020 11:13 EDT   DCP GENERIC CODE   Tracking Acuity :   4   Tracking Group :   ED [employer]   [place], Venida Jarvis - 12/15/2020 11:13 EDT   ED General Section :   Document assessment   Pregnancy Status :   Patient denies   ED Allergies Section :   Document assessment   ED Reason for Visit Section :   Document assessment   Johnnye Sima - 12/15/2020 11:13 EDT   ID Risk Screen Symptoms   Recent Travel History :   No recent travel   Close Contact with COVID-19 ID :   No   Last 14 days COVID-19 ID :   No   TB Symptom Screen :   No symptoms   C. diff Symptom/History ID :   Neither of the above   [place], RNRayna Sexton - 12/15/2020 11:13 EDT   Allergies   (As Of: 12/15/2020 11:15:55 EDT)   Allergies (Active)   No Known Medication Allergies  Estimated Onset Date:   Unspecified ; Created ByLeanna Battles, RN, JENNIFER L; Reaction Status:   Active ; Category:   Drug ; Substance:   No Known Medication Allergies ; Type:   Allergy ; Updated By:   Leanna Battles  RN, Lise Auer; Reviewed Date:   08/15/2020 13:59 EST        Psycho-Social   Last 3 mo, thoughts killing self/others :   Patient denies   Right click within box for Suspected Abuse policy link. :   None   Feels Safe Where Live :   Yes   ED Behavioral Activity Rating Scale :   4 - Quiet and awake (normal level of activity)   Johnnye Sima - 12/15/2020 11:13 EDT   ED Reason for Visit   (As Of: 12/15/2020 11:15:55 EDT)   Problems(Active)    No Chronic Problems ([facility]  :NKP )  Name of Problem:   No Chronic Problems ;  Recorder:   Roanna Banning C-RN; Code:   NKP ; Last Updated:   08/15/2020 13:17 EST ; Life Cycle Date:   08/15/2020 ; Life Cycle Status:   Active ; Vocabulary:   [facility]          Diagnoses(Active)    Vaginal swelling  Date:   12/15/2020 ; Diagnosis Type:   Reason For Visit ; Confirmation:   Complaint of ; Clinical Dx:   Vaginal swelling ; Classification:   Medical ; Clinical Service:   Emergency medicine ; Code:   PNED ; Probability:   0 ; Diagnosis Code:   [id number]

## 2020-12-15 NOTE — ED Notes (Signed)
ED Patient Education Note     Patient Education Materials Follows:           Moderate Conscious Sedation, Adult, Care After  Refer to this sheet in the next few weeks. These instructions provide you with information on caring for yourself after your procedure. Your health care provider may also give you more specific instructions. Your treatment has been planned according to current medical practices, but problems sometimes occur. Call your health care provider if you have any problems or questions after your procedure.  WHAT TO EXPECT AFTER THE PROCEDURE  After your procedure:  . You may feel sleepy, clumsy, and have poor balance for several hours.   . Vomiting may occur if you eat too soon after the procedure.   HOME CARE INSTRUCTIONS  . Do not participate in any activities where you could become injured for at least 24 hours. Do not:  ? Drive.  ? Swim.  ? Ride a bicycle.  ? Operate heavy machinery.  ? Cook.  ? Use power tools.  ? Climb ladders.  ? Work from a high place.  . Do not make important decisions or sign legal documents until you are improved.  . If you vomit, drink water, juice, or soup when you can drink without vomiting. Make sure you have little or no nausea before eating solid foods.  . Only take over-the-counter or prescription medicines for pain, discomfort, or fever as directed by your health care provider.  . Make sure you and your family fully understand everything about the medicines given to you, including what side effects may occur.  . You should not drink alcohol, take sleeping pills, or take medicines that cause drowsiness for at least 24 hours.  . If you smoke, do not smoke without supervision.  . If you are feeling better, you may resume normal activities 24 hours after you were sedated.  Marland Kitchen Keep all appointments with your health care provider.  SEEK MEDICAL CARE IF:  . Your skin is pale or bluish in color.  . You continue to feel nauseous or vomit.  . Your pain is getting worse and is  not helped by medicine.  . You have bleeding or swelling.  . You are still sleepy or feeling clumsy after 24 hours.  SEEK IMMEDIATE MEDICAL CARE IF:  . You develop a rash.  . You have difficulty breathing.  . You develop any type of allergic problem.  . You have a fever.  MAKE SURE YOU:  . Understand these instructions.  . Will watch your condition.  . Will get help right away if you are not doing well or get worse.  This information is not intended to replace advice given to you by your health care provider. Make sure you discuss any questions you have with your health care provider.  Document Released: 06/17/2013 Document Revised: 09/17/2014 Document Reviewed: 06/17/2013  Elsevier Interactive Patient Education [employer]

## 2020-12-15 NOTE — Discharge Summary (Signed)
ED Clinical Summary                         [facility]  [address]  [place], [place], [id number]  [phone]           PERSON INFORMATION  Name: Hyman BowerBROWN, Mireille Age:  [age] Years DOB: 06/26/2001   Sex: Female Language: English PCP: PCP,  NONE   Marital Status:  Single Phone: [phone] Med Service: MED-Medicine   MRN:  [id number] Acct# [id number] Arrival: 12/15/2020 11:08:00   Visit Reason: Vaginal swelling; VAGINAL SWELLING Acuity: 4 LOS: 000 05:31   Address:      [address] [place] [id number]  Diagnosis:      Bartholin gland cyst  Printed Prescriptions:            Allergies      No Known Medication Allergies      Medications Administered During Visit:                  Medication Dose Route   lidocaine 20 mL Infiltrate   ketamine 60 mg IV Push   morphine 4 mg IV Push   ceftriaxone 500 mg IM       Patient Medication List:              doxycycline (doxycycline monohydrate 100 mg oral capsule) 1 Capsules Oral (given by mouth) 2 times a day for 10 Days. Refills: 0.         Major Tests and Procedures:  The following procedures and tests were performed during your ED visit.  COMMONPROCEDURES%>  COMMON PROCEDURESCOMMENTS%>          Laboratory Orders  Name Status Details   .Preg U POC Completed Urine, RT, RT - Routine, Collected, 12/15/20 16:09:00 EDT, Nurse collect, 12/15/20 16:09:00 US/Eastern, RAL POC Login               Radiology Orders  No radiology orders were placed.              Patient Care Orders  Name Status Details   Bedside: Completed 12/15/20 12:54:00 EDT, Iodoform to tray, 12/15/20 12:54:00 EDT, 12/15/20 12:54:00 EDT, Once   Bedside: Completed 12/15/20 12:54:00 EDT, #11 blade to tray, 12/15/20 12:54:00 EDT, 12/15/20 12:54:00 EDT, Once   Bedside: Completed 12/15/20 12:54:00 EDT, NS 1 Liter bottle, 12/15/20 12:54:00 EDT, 12/15/20 12:54:00 EDT, Once   Bedside: Completed 12/15/20 13:49:00 EDT, Airway cart, 12/15/20 13:49:00 EDT, 12/15/20 13:49:00 EDT, Once    Bedside: Completed 12/15/20 13:49:00 EDT, Code cart, 12/15/20 13:49:00 EDT, 12/15/20 13:49:00 EDT, Once   Bedside: Completed 12/15/20 13:49:00 EDT, BVM & suction, 12/15/20 13:49:00 EDT, 12/15/20 13:49:00 EDT, Once   Cardiac/NIBP/Pulse Ox Monitoring Completed 12/15/20 13:49:00 EDT, This message can only be seen by Nursing, it is not visible to Pharmacy, Laboratory, or Radiology., 12/15/20 13:49:00 EDT, 12/15/20 13:49:00 EDT, Once   Communication to Nursing Completed 12/15/20 13:49:00 EDT, Consent to chart, 12/15/20 13:49:00 EDT, 12/15/20 13:49:00 EDT   Discharge Patient Ordered 12/15/20 16:13:00 EDT   ED Assessment Adult Completed 12/15/20 11:15:56 EDT, 12/15/20 11:15:56 EDT   ED Mod Sed Adult Print D/C Instructions Ordered 12/15/20 13:49:00 EDT, Click on Powerplan in Orders screen for reference document for printable instructions., 12/15/20 13:49:00 EDT, 12/15/20 13:49:00 EDT   ED Only Oxygen Therapy Completed 12/15/20 13:49:00 EDT, STAT 1 hour or less, 12/15/20 13:49:00 EDT, Keep SAT > 92%   ED Secondary Triage  Completed 12/15/20 11:15:56 EDT, 12/15/20 11:15:56 EDT   ED Triage Adult Completed 12/15/20 11:08:31 EDT, 12/15/20 11:08:31 EDT   End Tidal Carbon Dioxide Monitoring Completed 12/15/20 13:49:00 EDT, Once, 12/15/20 13:49:00 EDT   Incision & Drainage Tray Setup Completed 12/15/20 12:54:00 EDT, Once, 12/15/20 12:54:00 EDT   Notify Provider Completed 12/15/20 13:49:00 EDT, Page Respiratory Therapy, 12/15/20 13:49:00 EDT, 12/15/20 13:49:00 EDT, Once   POC-Urine Pregnancy Test collect Completed 12/15/20 15:04:00 EDT, Once, 12/15/20 15:04:00 EDT   Saline Lock Insert Completed 12/15/20 13:49:00 EDT, Once, 12/15/20 13:49:00 EDT             PROVIDER INFORMATION               Provider Role Assigned Feliberto Harts ED Nurse 12/15/2020 12:13:51 12/15/2020 15:11:45   MADDEN, Konrad Felix A-MD ED Provider 12/15/2020 12:16:41    Vangie Bicker ED Provider 12/15/2020 15:08:24    Beecher Mcardle ED Nurse 12/15/2020  15:14:09        Attending Physician:  Reina Fuse A-MD     Admit Doc  MADDEN,  LAYNE A-MD     Consulting Doc       VITALS INFORMATION  Vital Sign Triage Latest   Temp Oral ORAL_1%>36.9 degC ORAL%>36.9 degC   Temp Temporal TEMPORAL_1%> TEMPORAL%>   Temp Intravascular INTRAVASCULAR_1%> INTRAVASCULAR%>   Temp Axillary AXILLARY_1%> AXILLARY%>   Temp Rectal RECTAL_1%> RECTAL%>   02 Sat 98 % 96 %   Respiratory Rate RATE_1%>16 br/min RATE%>16 br/min   Peripheral Pulse Rate PULSE RATE_1%>68 bpm PULSE RATE%>95 bpm   Apical Heart Rate HEART RATE_1%> HEART RATE%>   Blood Pressure BLOOD PRESSURE_1%>/ BLOOD PRESSURE_1%>107 mmHg BLOOD PRESSURE%>92 mmHg / BLOOD PRESSURE%>82 mmHg                 Immunizations      No Immunizations Documented This Visit          DISCHARGE INFORMATION   Discharge Disposition: H Outpt-Sent Home   Discharge Location:    Home   Discharge Date and Time:    12/15/2020 16:39:06   ED Checkout Date and Time:    12/15/2020 16:39:06     DEPART REASON INCOMPLETE INFORMATION               Depart Action Incomplete Reason   Interactive View/I&O Recently assessed               Problems      Active           No Chronic Problems              Smoking Status      Never (less than 100 in lifetime)         PATIENT EDUCATION INFORMATION  Instructions:       Moderate Sedation Adult After Care (Custom)     Follow up:                    With: Address: When:   Please come back to the emergency department if your current symptoms worsen, if you develop concerning new symptoms, or for any medical emergency.         With: Address: When:   [facility] [address] [place], [place] [address]  [phone] Business (1) Within 1 week           ED PROVIDER DOCUMENTATION     Patient:   AHMANI, DAOUD  MRN: [id number]            FIN: [id number]               Age:   20 years     Sex:  Female     DOB:  02/06/2001   Associated Diagnoses:   Bartholin gland cyst; Vaginal swelling   Author:   Reina Fuse A-MD       Basic Information   Time seen: Provider Seen (ST)   ED Provider/Time:    Reina Fuse A-MD / 12/15/2020 12:16  .   Additional information: Chief Complaint from Nursing Triage Note   Chief Complaint  Chief Complaint: Pt c/o vaginal swelling, Pt dneis any urinary pain, discharge or bleeding. (12/15/20 11:13:00).      History of Present Illness   20 year old female history of prior Bartholin gland cyst presenting with swelling and pain on the inside of her left labia for the past several days.  States that previously they have drained spontaneously, she has been using hot compresses to try to get this with a drain but the pain is just worsening.  Has never had any surgical intervention on the cyst.      Review of Systems   Constitutional: No fevers or chills  Skin: Labial swelling per HPI  Respiratory: No shortness of breath   Cardiovascular: No chest pain   Abdominal: No abdominal pain, nausea, or vomiting  Neurologic: No headache or weakness         Health Status   Allergies:    Allergic Reactions (Selected)  No Known Medication Allergies.      Past Medical/ Family/ Social History   Medical history: Reviewed as documented in chart.   Surgical history: Reviewed as documented in chart.   Family history: Not significant.   Social history: Reviewed as documented in chart.   Problem list:    Active Problems (1)  No Chronic Problems   .      Physical Examination               Vital Signs   Vital Signs   12/15/2020 11:13 EDT Systolic Blood Pressure 144 mmHg  HI    Diastolic Blood Pressure 107 mmHg  >HHI    Temperature Oral 36.9 degC    Heart Rate Monitored 88 bpm    Respiratory Rate 16 br/min    SpO2 98 %   .   Measurements   12/15/2020 11:15 EDT Body Mass Index est meas 32.32 kg/m2    Body Mass Index Measured 32.32 kg/m2   12/15/2020 11:13 EDT Height/Length Measured 165 cm    Weight Dosing 88 kg   .   Basic Oxygen Information   12/15/2020 12:20 EDT Oxygen Therapy Room air   12/15/2020 11:13 EDT Oxygen Therapy Room air    SpO2 98  %   .   General: Alert, NAD  Head: NCAT  Eye: Normal conjunctiva, vision grossly normal  Neck: No swelling or stridor  Cardiovascular: RRR, well perfused  Respiratory: Normal WOB, no stridor  Fullness to the inferior medial left labia with some associated tenderness, no overlying skin changes or drainage  Musculoskeletal: No deformities  Neurologic: Alert, normal speech, moves all extremities  Psychiatric: Cooperative with appropriate mood and affect      Medical Decision Making   Rationale:  Suspect Bartholin gland cyst/abscess to the left labia    Discussed with patient drainage and she is requesting sedation for the procedure  We performed a moderate sedation with 70 mg of IV ketamine, the abscess was drained with 11 blade a linear incision and packed with 1/4 inch iodoform gauze.  There was brisk and copious malodorous drainage after the incision and loculations were disrupted manually    Patient had emergence reaction after sedation we will watch her in the emergency department until her symptoms improve.  I signed out her care to Dr. Laurine Blazer and will consider using IV midazolam should her symptoms not improve soon    We will give her a dose of ceftriaxone 500 g IV now, and a prescription for doxycycline twice daily.  We will check a pregnancy test prior to giving her the doxycycline prescription.    She will need to follow-up with an obstetrician for definitive management given that she has had recurrence of this problem.      Reexamination/ Reevaluation   Time: 12/15/2020 16:11:00 .   Notes: Took a handoff from Dr. Rubye Oaks, awaiting her to wake up from her ketamine anesthesia.  She is awake and alert and talkative and not having any sedation symptoms.  She is ambulatory.  We will discharge her with routine instructions.      Procedure   Incision and drainage   Time: 12/15/2020 15:00:00 .    Confirmed: Patient, procedure, side, and site correct, Time-out taken prior to procedure.    Consent: Patient.     Indication: Abscess.    Pre procedure exam: Circulation, motor, and sensory intact.    Procedural sedation: See nurse's notes.    Monitoring: Cardiac, blood pressure, continuous pulse oximetry, See nurse's notes.       Description   Location: left labia.   Anesthesia: 8 ml, 1% lidocaine.   Preparation: skin prepped with chlorhexidine.   Incision: 2 cm incision was made, using a # 11 blade scalpel.   Technique: fluid collection was manually decompressed, wound probed, loculations decompressed.   Drainage: large amount, purulent.   Wound: packing placed in wound cavity.   Post procedure exam: Circulation, motor, sensory examination intact.    Patient tolerated: Well.    Complications: None.    Performed by: Self.    Total time: 15 minutes.    Procedural sedation   Time: 12/15/2020 15:00:00 .    Confirmed: Patient and procedure correct, Time-out taken prior to procedure.    Consent: Patient.    Indication: Incision and drainage.    Monitoring: Cardiac, blood pressure, continuous pulse oximetry, See nurse's notes.    Preparation: Suction, IV access, Constant attendance, Supplemental oxygen.    ASA Class: I- healthy patient.    Mallampati: Class II.    Physical exam: Airway: appears normal, Heart: regular rate and rhythm, Breath sounds: equal, Neuro: normal.    Pre sedation vital signs: See nurse's notes.    Procedural sedation: See nurse's notes.    Post sedation vital signs: See nurse's notes.    Procedure Time: See hospital procedure form.    Post sedation condition: Stable.    Patient tolerated: Well.    Complications: emergence reaction from ketamine.    Performed by: Self.       Impression and Plan   Diagnosis   Bartholin gland cyst (ICD10-CM N75.0, Discharge, Medical)   Complaint of Vaginal swelling (PNED [id number], Reason For Visit, Emergency medicine, Medical)   Plan   Condition: Improved.    Disposition: Medically cleared, Discharged: to home.    Patient was given the following  educational materials: Moderate Sedation Adult After Care (  Custom).    Follow up with: Please come back to the emergency department if your current symptoms worsen, if you develop concerning new symptoms, or for any medical emergency.Raquel James Within 1 week.    Counseled: Patient, Regarding diagnosis, Regarding diagnostic results, Regarding treatment plan, Patient indicated understanding of instructions.

## 2020-12-15 NOTE — ED Notes (Signed)
ED Note-Nursing       ED RN Reassessment Entered On:  12/15/2020 15:08 EDT    Performed On:  12/15/2020 14:36 EDT by Michiel Sites               ED RN Reassessment   ED RN Progress Note :   Pt demonstrates an understanding of medication and side effects. Pt has signed consent.    Michiel Sites - 12/15/2020 15:07 EDT

## 2020-12-15 NOTE — ED Provider Notes (Signed)
General Medical Problem *ED        Patient:   MAISON, AGRUSA             MRN: [id number]            FIN: [id number]               Age:   20 years     Sex:  Female     DOB:  [birth year]   Associated Diagnoses:   Bartholin gland cyst; Vaginal swelling   Author:   Reina Fuse A-MD      Basic Information   Time seen: Provider Seen (ST)   ED Provider/Time:    Reina Fuse A-MD / 12/15/2020 12:16  .   Additional information: Chief Complaint from Nursing Triage Note   Chief Complaint  Chief Complaint: Pt c/o vaginal swelling, Pt dneis any urinary pain, discharge or bleeding. (12/15/20 11:13:00).      History of Present Illness   20 year old female history of prior Bartholin gland cyst presenting with swelling and pain on the inside of her left labia for the past several days.  States that previously they have drained spontaneously, she has been using hot compresses to try to get this with a drain but the pain is just worsening.  Has never had any surgical intervention on the cyst.      Review of Systems   Constitutional: No fevers or chills  Skin: Labial swelling per HPI  Respiratory: No shortness of breath   Cardiovascular: No chest pain   Abdominal: No abdominal pain, nausea, or vomiting  Neurologic: No headache or weakness         Health Status   Allergies:    Allergic Reactions (Selected)  No Known Medication Allergies.      Past Medical/ Family/ Social History   Medical history: Reviewed as documented in chart.   Surgical history: Reviewed as documented in chart.   Family history: Not significant.   Social history: Reviewed as documented in chart.   Problem list:    Active Problems (1)  No Chronic Problems   .      Physical Examination               Vital Signs   Vital Signs   12/15/2020 11:13 EDT Systolic Blood Pressure 144 mmHg  HI    Diastolic Blood Pressure 107 mmHg  >HHI    Temperature Oral 36.9 degC    Heart Rate Monitored 88 bpm    Respiratory Rate 16 br/min    SpO2 98 %   .   Measurements   12/15/2020 11:15 EDT  Body Mass Index est meas 32.32 kg/m2    Body Mass Index Measured 32.32 kg/m2   12/15/2020 11:13 EDT Height/Length Measured 165 cm    Weight Dosing 88 kg   .   Basic Oxygen Information   12/15/2020 12:20 EDT Oxygen Therapy Room air   12/15/2020 11:13 EDT Oxygen Therapy Room air    SpO2 98 %   .   General: Alert, NAD  Head: NCAT  Eye: Normal conjunctiva, vision grossly normal  Neck: No swelling or stridor  Cardiovascular: RRR, well perfused  Respiratory: Normal WOB, no stridor  Fullness to the inferior medial left labia with some associated tenderness, no overlying skin changes or drainage  Musculoskeletal: No deformities  Neurologic: Alert, normal speech, moves all extremities  Psychiatric: Cooperative with appropriate mood and affect      Medical Decision Making   Rationale:  Suspect Bartholin gland cyst/abscess to the left labia    Discussed with patient drainage and she is requesting sedation for the procedure    We performed a moderate sedation with 70 mg of IV ketamine, the abscess was drained with 11 blade a linear incision and packed with 1/4 inch iodoform gauze.  There was brisk and copious malodorous drainage after the incision and loculations were disrupted manually    Patient had emergence reaction after sedation we will watch her in the emergency department until her symptoms improve.  I signed out her care to Dr. Laurine Blazer and will consider using IV midazolam should her symptoms not improve soon    We will give her a dose of ceftriaxone 500 g IV now, and a prescription for doxycycline twice daily.  We will check a pregnancy test prior to giving her the doxycycline prescription.    She will need to follow-up with an obstetrician for definitive management given that she has had recurrence of this problem.      Reexamination/ Reevaluation   Time: 12/15/2020 16:11:00 .   Notes: Took a handoff from Dr. Rubye Oaks, awaiting her to wake up from her ketamine anesthesia.  She is awake and alert and talkative and not having any  sedation symptoms.  She is ambulatory.  We will discharge her with routine instructions.      Procedure   Incision and drainage   Time: 12/15/2020 15:00:00 .    Confirmed: Patient, procedure, side, and site correct, Time-out taken prior to procedure.    Consent: Patient.    Indication: Abscess.    Pre procedure exam: Circulation, motor, and sensory intact.    Procedural sedation: See nurse's notes.    Monitoring: Cardiac, blood pressure, continuous pulse oximetry, See nurse's notes.       Description   Location: left labia.   Anesthesia: 8 ml, 1% lidocaine.   Preparation: skin prepped with chlorhexidine.   Incision: 2 cm incision was made, using a # 11 blade scalpel.   Technique: fluid collection was manually decompressed, wound probed, loculations decompressed.   Drainage: large amount, purulent.   Wound: packing placed in wound cavity.   Post procedure exam: Circulation, motor, sensory examination intact.    Patient tolerated: Well.    Complications: None.    Performed by: Self.    Total time: 15 minutes.    Procedural sedation   Time: 12/15/2020 15:00:00 .    Confirmed: Patient and procedure correct, Time-out taken prior to procedure.    Consent: Patient.    Indication: Incision and drainage.    Monitoring: Cardiac, blood pressure, continuous pulse oximetry, See nurse's notes.    Preparation: Suction, IV access, Constant attendance, Supplemental oxygen.    ASA Class: I- healthy patient.    Mallampati: Class II.    Physical exam: Airway: appears normal, Heart: regular rate and rhythm, Breath sounds: equal, Neuro: normal.    Pre sedation vital signs: See nurse's notes.    Procedural sedation: See nurse's notes.    Post sedation vital signs: See nurse's notes.    Procedure Time: See hospital procedure form.    Post sedation condition: Stable.    Patient tolerated: Well.    Complications: emergence reaction from ketamine.    Performed by: Self.       Impression and Plan   Diagnosis   Bartholin gland cyst (ICD10-CM N75.0,  Discharge, Medical)   Complaint of Vaginal swelling (PNED [id number], Reason For Visit, Emergency medicine, Medical)   Plan  Condition: Improved.    Disposition: Medically cleared, Discharged: to home.    Patient was given the following educational materials: Moderate Sedation Adult After Care (Custom).    Follow up with: Please come back to the emergency department if your current symptoms worsen, if you develop concerning new symptoms, or for any medical emergency.Raquel James Within 1 week.    Counseled: Patient, Regarding diagnosis, Regarding diagnostic results, Regarding treatment plan, Patient indicated understanding of instructions.    Signature Line     Electronically Signed on 12/15/2020 03:27 PM EDT   ________________________________________________   Reina Fuse A-MD      Electronically Signed on 12/15/2020 04:13 PM EDT   ________________________________________________   Vangie Bicker            Modified by: Reina Fuse A-MD on 12/15/2020 03:27 PM EDT      Modified by: Vangie Bicker on 12/15/2020 04:13 PM EDT

## 2020-12-15 NOTE — ED Notes (Signed)
ED Note-Nursing       ED RN Reassessment Entered On:  12/15/2020 15:09 EDT    Performed On:  12/15/2020 15:09 EDT by Michiel Sites               ED RN Reassessment   ED RN Progress Note :   Pt is awake but crying and anxious. RT is by beside with nurse    Michiel Sites - 12/15/2020 15:09 EDT

## 2021-07-06 ENCOUNTER — Encounter: Attending: Specialist | Primary: Sports Medicine

## 2021-07-08 LAB — POC PREGNANCY UR-QUAL: Preg Test, Ur: NEGATIVE

## 2021-07-08 NOTE — ED Notes (Signed)
ED Patient Education Note     Patient Education Materials Follows:  Immunology     Allergy Skin Testing    Why am I having this test?        Allergy skin testing is done to check whether you have an allergy to something. An allergy occurs when your body's defense system (immune system) is more sensitive to certain substances. The immune system overreacts to the substance, causing allergy symptoms. Allergy skin testing can be used to help find out which substance may be causing your symptoms.    Skin testing may be done in one of three ways:   Injecting a small amount of the substance that you may be allergic to (allergen) under your skin (intradermal test).     Placing a drop of a solution that contains the allergen onto your forearm or back, then puncturing the skin where the droplet is and allowing the fluid to seep in (prick-puncture test or scratch test).     Applying the allergen to patches that are placed on your skin (patch test).        What is being tested?    This test checks to see if an allergic reaction develops on the skin where the allergen was injected or where the patches were applied. Common allergens tested include:   Pollens.     Dust.     Food.     Latex.        How do I prepare for this test?     If you are having the intradermal or prick-puncture test, no preparation is needed.     If you are having a patch test:  ? Do not apply ointments, creams, or lotion to the skin where the patch will be placed. Usually, the patches are placed on your forearm or on your back.    ? Bring any items that you think you are allergic to, such as cosmetics, soaps, and perfume.        Tell a health care provider about:     Any allergies you have, especially if you have ever had a severe allergic reaction.     All medicines you are taking, including vitamins, herbs, eye drops, creams, and over-the-counter medicines. Some medicines can affect test results. Your health care provider will let you know when to stop  taking those medicines and when you can begin taking them again.     Any medical conditions you have.      What happens during the test?    If you will receive an injection or prick-puncture, it will be done in your health care provider's office, and you will likely get your results before leaving. If patches will be applied to your skin, you will need to:   Wear them for 48 hours.     Return to your health care provider's office to have them removed. Do not remove them yourself.     Avoid bathing and activities that cause heavy sweating until after the patches are removed.        How are the results reported?    Your test results will be reported as measurements of any areas of skin affected. Your health care provider will compare your results to normal ranges that were established after testing a large group of people (reference ranges). Reference ranges may vary among labs and hospitals. For this test, a common normal reference range is:   A swollen area of skin (wheal) less than 3   mm in diameter.     Surrounding redness and swelling (flare) less than 10 mm in diameter.        What do the results mean?     A result in which the wheal is 3 mm or more and the flare is 10 mm or more means that you are likely allergic to the allergen.    Talk with your health care provider about what your results mean. Your health care provider will consider the results of your test in addition to your symptoms before diagnosing you with an allergy.      Questions to ask your health care provider    Ask your health care provider, or the department that is doing the test:   When will my results be ready?     How will I get my results?     What are my treatment options?     What other tests do I need?     What are my next steps?        Summary     Allergy skin testing is done to check whether you have an allergy to something.     This test checks to see if an allergic reaction develops on the skin where the allergen was injected or  where the patches were applied.     Skin testing may be done in one of three ways: an intradermal test, a prick-puncture or scratch test, or a patch test.     A wheal or flare on the skin that is larger than a certain size means that you are likely allergic to the allergen.     Talk with your health care provider about what your results mean.      This information is not intended to replace advice given to you by your health care provider. Make sure you discuss any questions you have with your health care provider.      Document Revised: 12/11/2019 Document Reviewed: 12/11/2019  Elsevier Patient Education ? 2021 Elsevier Inc.

## 2021-07-08 NOTE — ED Provider Notes (Signed)
Skin Complaints PED        Patient:   Barbara Hanna, Barbara Hanna             MRN: [id number]            FIN: [id number]               Age:   20 years     Sex:  Female     DOB:  [birth year]   Associated Diagnoses:   Allergic reaction   Author:   Gaetana Michaelis N-PA-C      Basic Information   Additional information: Chief Complaint from Nursing Triage Note   Chief Complaint  Chief Complaint: Pt states she is breaking out in bumps x2 days. Pt states they hurt and itch. (07/08/21 14:01:00).      History of Present Illness        The patient presents with total body rash that began on thighs while laying in bed and now reports most severe around the neck. Prior rash similar when she was pregnant a few years ago. Patient's LMP 9/29.  The exacerbating factor is none.  The relieving factor is none.  The associated symptom is itching but not fever.  There are risk factors including none and No h/o anaphylaxis. No food or drug allergies.  Therapy today: generic Benadryl 50mg  at 8am.     Review of Systems   Constitutional symptoms: Negative except as documented in HPI, denies fever, denies chills.   Skin symptoms: Rash, pruritus, denies hives.   Eye symptoms: Negative except as documented in HPI.   ENMT symptoms: No swelling of the lips, tongue or throat.   Respiratory symptoms: Negative except as documented in HPI, denies shortness of breath.   Gastrointestinal symptoms: Negative except as documented in HPI.   Musculoskeletal symptoms: Negative except as documented in HPI.   Neurologic symptoms: Negative except as documented in HPI.   Allergy/immunologic symptoms: Negative except as documented in HPI.   Additional review of systems information: All other systems reviewed and otherwise negative.      Health Status   Allergies:    Allergic Reactions (Selected)  No Known Medication Allergies.      Past Medical/ Family/ Social History      Medical history   Reviewed as documented in chart.   Surgical history: Reviewed as documented in chart.    Family history: Reviewed as documented in chart.   Social history: Reviewed as documented in chart.      Physical Examination   Vital signs: Vital Signs   07/08/2021 15:41 EDT SpO2 100 %   07/08/2021 14:01 EDT Systolic Blood Pressure 121 mmHg    Diastolic Blood Pressure 72 mmHg    Temperature Oral 36.6 degC    Heart Rate Monitored 70 bpm    Respiratory Rate 16 br/min    SpO2 100 %   , Measurements   07/08/2021 14:03 EDT Body Mass Index est meas 33.12 kg/m2    Body Mass Index Measured 33.12 kg/m2   07/08/2021 14:01 EDT Height/Length Measured 163 cm    Weight Dosing 88 kg   , Oxygen saturation: Basic Oxygen Information   07/08/2021 15:41 EDT Oxygen Therapy Room air    SpO2 100 %   07/08/2021 14:01 EDT SpO2 100 %   .   General:  Alert.  cooperative.     Skin:  flesh colored 1-19mm papular rash diffuse total body and worse right side neck extending to the anterior neck with mild erythema.  No hives.  Sparing the palms and soles.     Head:  Normocephalic.  atraumatic.     Neck:  Supple.  trachea midline.     Ears, nose, mouth and throat:  no oral lesions.  posterior pharynx patent.     Cardiovascular:  Regular rate and rhythm   Respiratory:  Lungs are clear to auscultation.  respirations are non-labored.  breath sounds are equal.  No wheezing/stridor.     Gastrointestinal:  Soft.  Nontender.  Non distended.  Normal bowel sounds.        Medical Decision Making   Documents reviewed:  Emergency department nurses' notes, emergency department records.    Results review:  Lab results : Lab View   07/08/2021 16:53 EDT     Preg U POC                Negative  .   Notes  No signs of anaphylaxis, TEN or SJS. Patient's urine HCG negative. She was given Decadron and to begin Prednisone as prescribed. Uncertain etiology for her allergic reaction. To f/u w/Allergist if not improved..      Reexamination/ Reevaluation   Re-examination/Re-evaluation:  Vital signs: Basic Oxygen Information   07/08/2021 15:41 EDT Oxygen Therapy Room air     SpO2 100 %   07/08/2021 14:01 EDT SpO2 100 %   .      Impression and Plan   Diagnosis   Allergic reaction (ICD10-CM T78.40XA, Discharge, Medical)   Plan   Condition: Improved, Stable.    Disposition: Discharged: Time  07/08/2021 17:01:00, to home.    Prescriptions: [facility] prescriptions   Pharmacy:  triamcinolone 0.1% topical cream (Prescribe): 1 app, Topical, TID, for 7 days, 454 g, 0 Refill(s)  predniSONE 20 mg oral tablet (Prescribe): 40 mg, 2 tabs, Oral, Daily, for 5 days, 10 tabs, 0 Refill(s).    Patient was given the following educational materials: Allergy Skin Testing, Allergy Skin Testing.    Follow up with: Andres Shad Within 1 week Please follow-up with the Allergist if not improved with Benadry, Prednisone and Topical Hydrocortisone cream. Return to the ER for fever or any concerns..    Counseled: Patient, Regarding diagnosis, Regarding diagnostic results, Regarding treatment plan, Regarding prescription, Patient indicated understanding of instructions.      Signature Line     Electronically Signed on 07/08/2021 05:02 PM EDT   ________________________________________________   Gaetana Michaelis N-PA-C      Electronically Signed on 07/08/2021 05:07 PM EDT   ________________________________________________   Levy Pupa CLIVE-MD            Modified by: Gaetana Michaelis N-PA-C on 07/08/2021 04:34 PM EDT      Modified by: Gaetana Michaelis N-PA-C on 07/08/2021 05:02 PM EDT

## 2021-07-08 NOTE — ED Notes (Signed)
 ED Patient Summary       ;       [facility] Emergency Department  [address], [place] [address]  [phone]  Discharge Instructions (Patient)  Name: Barbara Hanna, Barbara Hanna  DOB: Aug 27, 2001                   MRN: [id number]                   FIN: [phone]  Reason For Visit: Rash; ALLERGIC REACTION, HIVES  Final Diagnosis: Allergic reaction     Visit Date: 07/08/2021 13:44:00  Address: [address] [place] [address]  Phone: [phone]     Emergency Department Providers:         Primary Physician:      KERNODLE, JUSTIN N      [facility] would like to thank you for allowing us  to assist you with your healthcare needs. The following includes patient education materials and information regarding your injury/illness.     Follow-up Instructions:  You were seen today on an emergency basis. Please contact your primary care doctor for a follow up appointment. If you received a referral to a specialist doctor, it is important you follow-up as instructed.    It is important that you call your follow-up doctor to schedule and confirm the location of your next appointment. Your doctor may practice at multiple locations. The office location of your follow-up appointment may be different to the one written on your discharge instructions.    If you do not have a primary care doctor, please call [phone] for help in finding a [name] Provider. For help in finding a specialist doctor, please call [phone].    If your condition gets worse before your follow-up with your primary care doctor or specialist, please return to the Emergency Department.      Coronavirus 2019 (COVID-19) Reminders:     Patients age 79 - 9, with parental consent, and patients over age 29 can make an appointment for a COVID-19 vaccine. Patients can contact their [name] doctors' offices to schedule an appointment to receive the COVID-19 vaccine. Patients who  do not have a [name] physician can call [phone] to schedule vaccination appointments.      Follow Up Appointments:  Primary Care Provider:     Name: PCP,  NONE     Phone:                  With: Address: When:   [facility] [address] [place] [place], [place] [address]  [phone] Within 1 week   Comments:   Please follow-up with the Allergist if not improved with Benadry, Prednisone and Topical Hydrocortisone cream. Return to the ER for fever or any concerns.              Post [facility] SERVICES%>          New Medications  Printed Prescriptions  predniSONE (predniSONE 20 mg oral tablet) 2 Tabs Oral (given by mouth) every day for 5 Days. Refills: 0.  Last Dose:____________________  triamcinolone topical (triamcinolone 0.1% topical cream) 1 Application Topical (on the skin) 3 times a day for 7 Days. Refills: 0.,  THIS IS A PATIENT SPECIFIC  MEDICATION IN YOUR PYXIS MACHINE   Last Dose:____________________      Allergy Info: No Known Medication Allergies     Discharge Additional Information  Discharge Patient 07/08/21 17:02:00 EDT      Patient Education Materials:        Allergy Skin Testing    Why am I having this test?        Allergy skin testing is done to check whether you have an allergy to something. An allergy occurs when your body's defense system (immune system) is more sensitive to certain substances. The immune system overreacts to the substance, causing allergy symptoms. Allergy skin testing can be used to help find out which substance may be causing your symptoms.    Skin testing may be done in one of three ways:   Injecting a small amount of the substance that you may be allergic to (allergen) under your skin (intradermal test).     Placing a drop of a solution that contains the allergen onto your forearm or back, then puncturing the skin where the droplet is and allowing the fluid to seep in (prick-puncture test or scratch test).     Applying the  allergen to patches that are placed on your skin (patch test).        What is being tested?    This test checks to see if an allergic reaction develops on the skin where the allergen was injected or where the patches were applied. Common allergens tested include:   Pollens.     Dust.     Food.     Latex.        How do I prepare for this test?     If you are having the intradermal or prick-puncture test, no preparation is needed.     If you are having a patch test:  ? Do not apply ointments, creams, or lotion to the skin where the patch will be placed. Usually, the patches are placed on your forearm or on your back.    ? Bring any items that you think you are allergic to, such as cosmetics, soaps, and perfume.        Tell a health care provider about:     Any allergies you have, especially if you have ever had a severe allergic reaction.     All medicines you are taking, including vitamins, herbs, eye drops, creams, and over-the-counter medicines. Some medicines can affect test results. Your health care provider will let you know when to stop taking those medicines and when you can begin taking them again.     Any medical conditions you have.      What happens during the test?    If you will receive an injection or prick-puncture, it will be done in your health care provider's office, and you will likely get your results before leaving. If patches will be applied to your skin, you will need to:   Wear them for 48 hours.     Return to your health care provider's office to have them removed. Do not remove them yourself.     Avoid bathing and activities that cause heavy sweating until after the patches are removed.        How are the results reported?    Your test results will be reported as measurements of any areas of skin affected. Your health care provider will compare your results to normal ranges that were established after testing a large group of people (reference ranges). Reference ranges may vary among labs  and hospitals. For this test, a common normal reference range is:   A swollen area of skin (wheal)  less than 3 mm in diameter.     Surrounding redness and swelling (flare) less than 10 mm in diameter.        What do the results mean?     A result in which the wheal is 3 mm or more and the flare is 10 mm or more means that you are likely allergic to the allergen.    Talk with your health care provider about what your results mean. Your health care provider will consider the results of your test in addition to your symptoms before diagnosing you with an allergy.      Questions to ask your health care provider    Ask your health care provider, or the department that is doing the test:   When will my results be ready?     How will I get my results?     What are my treatment options?     What other tests do I need?     What are my next steps?        Summary     Allergy skin testing is done to check whether you have an allergy to something.     This test checks to see if an allergic reaction develops on the skin where the allergen was injected or where the patches were applied.     Skin testing may be done in one of three ways: an intradermal test, a prick-puncture or scratch test, or a patch test.     A wheal or flare on the skin that is larger than a certain size means that you are likely allergic to the allergen.     Talk with your health care provider about what your results mean.      This information is not intended to replace advice given to you by your health care provider. Make sure you discuss any questions you have with your health care provider.      Document Revised: 12/11/2019 Document Reviewed: 12/11/2019  Elsevier Patient Education ? 2021 Elsevier Inc.      ---------------------------------------------------------------------------------------------------------------------  [facility] allows patients to review your COVID and other test results as well as discharge documents from any [name]  [facility], Emergency Department, surgical center or outpatient lab. Test results are typically available 36 hours after the test is completed.     [name] encourages you to self-enroll in the [facility] Patient Portal.     To begin your self-enrollment process, please visit [email or web address] Under [facility], click on "Sign up now".     NOTE: You must be 16 years and older to use [facility] Self-Enroll online. If you are a parent, caregiver, or guardian; you need an invite to access your child's or dependent's health records. To obtain an invite, contact the Medical Records department at [phone] Monday through Friday, 8-4:30, select option 3 . If we receive your call afterhours, we will return your call the next business day.     If you have issues trying to create or access your account, contact [employer] support at [phone] available 7 days a week 24 hours a day.     Comment:

## 2021-07-08 NOTE — Discharge Summary (Signed)
 ED Clinical Summary                     [facility]  [address]  [place], [place], [address]  [phone]          PERSON INFORMATION  Name: Barbara Hanna, Barbara Hanna Age:  20 Years DOB: Sep 26, 2000   Sex: Female Language: English PCP: PCP,  NONE   Marital Status: Single Phone: [phone] Med Service: JENENE DELLIE Sabot   MRN: [id number] Acct# [id number] Arrival: 07/08/2021 13:44:00   Visit Reason: Rash; ALLERGIC REACTION, HIVES Acuity: 4 LOS: 000 04:10   Address:    [address] [place] [address]   Diagnosis:    Allergic reaction  Medications:          New Medications  Printed Prescriptions  predniSONE (predniSONE 20 mg oral tablet) 2 Tabs Oral (given by mouth) every day for 5 Days. Refills: 0.  Last Dose:____________________  triamcinolone topical (triamcinolone 0.1% topical cream) 1 Application Topical (on the skin) 3 times a day for 7 Days. Refills: 0.,  THIS IS A PATIENT SPECIFIC  MEDICATION IN YOUR PYXIS MACHINE   Last Dose:____________________      Medications Administered During Visit:                Medication Dose Route   dexamethasone 10 mg Oral               Allergies      No Known Medication Allergies      Major Tests and Procedures:  The following procedures and tests were performed during your ED visit.  COMMON PROCEDURES%>  COMMON PROCEDURES COMMENTS%>                PROVIDER INFORMATION               Provider Role Assigned Unassigned   KERNODLE, JUSTIN N-PA-C ED MidLevel 07/08/2021 15:02:38    Dasie RN, Sharlet BIRCH ED Nurse 07/08/2021 15:05:22        Attending Physician:  KERNODLE,  JUSTIN N-PA-C      Admit Doc  KERNODLE,  JUSTIN N-PA-C     Consulting Doc       VITALS INFORMATION  Vital Sign Triage Latest   Temp Oral ORAL_1%> ORAL%>   Temp Temporal TEMPORAL_1%> TEMPORAL%>   Temp Intravascular INTRAVASCULAR_1%> INTRAVASCULAR%>   Temp Axillary AXILLARY_1%> AXILLARY%>   Temp Rectal RECTAL_1%> RECTAL%>   02 Sat 100 % 100 %   Respiratory Rate RATE_1%> RATE%>    Peripheral Pulse Rate PULSE RATE_1%> PULSE RATE%>   Apical Heart Rate HEART RATE_1%> HEART RATE%>   Blood Pressure BLOOD PRESSURE_1%>/ BLOOD PRESSURE_1%>72 mmHg BLOOD PRESSURE%> / BLOOD PRESSURE%>70 mmHg                 Immunizations      No Immunizations Documented This Visit          DISCHARGE INFORMATION   Discharge Disposition: H Outpt-Sent Home   Discharge Location:  Home   Discharge Date and Time:  07/08/2021 17:54:50   ED Checkout Date and Time:  07/08/2021 17:54:50     DEPART REASON INCOMPLETE INFORMATION               Depart Action Incomplete Reason   Interactive View/I&O Recently assessed               Problems      Active           No Chronic  Problems              Smoking Status      Never (less than 100 in lifetime)         PATIENT EDUCATION INFORMATION  Instructions:     Allergy Skin Testing     Follow up:                   With: Address: When:   [facility] [address] [place] [place], [place] [address]  [phone] Within 1 week   Comments:   Please follow-up with the Allergist if not improved with Benadry, Prednisone and Topical Hydrocortisone cream. Return to the ER for fever or any concerns.              ED PROVIDER DOCUMENTATION     Patient:   Barbara Hanna, Barbara Hanna             MRN: [id number]            FIN: [id number]               Age:   20 years     Sex:  Female     DOB:  11/28/2000   Associated Diagnoses:   Allergic reaction   Author:   KERNODLE,  JUSTIN N-PA-C      Basic Information   Additional information: Chief Complaint from Nursing Triage Note   Chief Complaint  Chief Complaint: Pt states she is breaking out in bumps x2 days. Pt states they hurt and itch. (07/08/21 14:01:00).      History of Present Illness        The patient presents with total body rash that began on thighs while laying in bed and now reports most severe around the neck. Prior rash similar when she was pregnant a few years ago. Patient's LMP 9/29.  The exacerbating factor is none.  The relieving factor is none.  The  associated symptom is itching but not fever.  There are risk factors including none and No h/o anaphylaxis. No food or drug allergies.  Therapy today: generic Benadryl 50mg  at 8am.     Review of Systems   Constitutional symptoms: Negative except as documented in HPI, denies fever, denies chills.   Skin symptoms: Rash, pruritus, denies hives.   Eye symptoms: Negative except as documented in HPI.   ENMT symptoms: No swelling of the lips, tongue or throat.[name]   Respiratory symptoms: Negative except as documented in HPI, denies shortness of breath.   Gastrointestinal symptoms: Negative except as documented in HPI.   Musculoskeletal symptoms: Negative except as documented in HPI.   Neurologic symptoms: Negative except as documented in HPI.   Allergy/immunologic symptoms: Negative except as documented in HPI.   Additional review of systems information: All other systems reviewed and otherwise negative.      Health Status   Allergies:    Allergic Reactions (Selected)  No Known Medication Allergies.      Past Medical/ Family/ Social History      Medical history   Reviewed as documented in chart.   Surgical history: Reviewed as documented in chart.   Family history: Reviewed as documented in chart.   Social history: Reviewed as documented in chart.      Physical Examination   Vital signs: Vital Signs   07/08/2021 15:41 EDT SpO2 100 %   07/08/2021 14:01 EDT Systolic Blood Pressure 121 mmHg    Diastolic Blood Pressure 72 mmHg    Temperature Oral 36.6 degC  Heart Rate Monitored 70 bpm    Respiratory Rate 16 br/min    SpO2 100 %   , Measurements   07/08/2021 14:03 EDT Body Mass Index est meas 33.12 kg/m2    Body Mass Index Measured 33.12 kg/m2   07/08/2021 14:01 EDT Height/Length Measured 163 cm    Weight Dosing 88 kg   , Oxygen saturation: Basic Oxygen Information   07/08/2021 15:41 EDT Oxygen Therapy Room air    SpO2 100 %   07/08/2021 14:01 EDT SpO2 100 %   .   General:  Alert.  cooperative.     Skin:  flesh colored 1-86mm  papular rash diffuse total body and worse right side neck extending to the anterior neck with mild erythema.  No hives.  Sparing the palms and soles.     Head:  Normocephalic.  atraumatic.     Neck:  Supple.  trachea midline.     Ears, nose, mouth and throat:  no oral lesions.  posterior pharynx patent.     Cardiovascular:  Regular rate and rhythm   Respiratory:  Lungs are clear to auscultation.  respirations are non-labored.  breath sounds are equal.  No wheezing/stridor.     Gastrointestinal:  Soft.  Nontender.  Non distended.  Normal bowel sounds.        Medical Decision Making   Documents reviewed:  Emergency department nurses' notes, emergency department records.    Results review:  Lab results : Lab View   07/08/2021 16:53 EDT     Preg U POC                Negative  .   Notes  No signs of anaphylaxis, TEN or SJS. Patient's urine HCG negative. She was given Decadron and to begin Prednisone as prescribed. Uncertain etiology for her allergic reaction. To f/u w/Allergist if not improved..      Reexamination/ Reevaluation   Re-examination/Re-evaluation:  Vital signs: Basic Oxygen Information   07/08/2021 15:41 EDT Oxygen Therapy Room air    SpO2 100 %   07/08/2021 14:01 EDT SpO2 100 %   .      Impression and Plan   Diagnosis   Allergic reaction (ICD10-CM T78.40XA, Discharge, Medical)   Plan   Condition: Improved, Stable.    Disposition: Discharged: Time  07/08/2021 17:01:00, to home.    Prescriptions: Launch prescriptions   Pharmacy:  triamcinolone 0.1% topical cream (Prescribe): 1 app, Topical, TID, for 7 days, 454 g, 0 Refill(s)  predniSONE 20 mg oral tablet (Prescribe): 40 mg, 2 tabs, Oral, Daily, for 5 days, 10 tabs, 0 Refill(s).    Patient was given the following educational materials: Allergy Skin Testing, Allergy Skin Testing.    Follow up with: NORLEEN AUSTIN Within 1 week Please follow-up with the Allergist if not improved with Benadry, Prednisone and Topical Hydrocortisone cream. Return to the ER for fever or  any concerns..    Counseled: Patient, Regarding diagnosis, Regarding diagnostic results, Regarding treatment plan, Regarding prescription, Patient indicated understanding of instructions.

## 2021-07-08 NOTE — ED Notes (Signed)
ED Triage Note       ED Triage Adult Entered On:  07/08/2021 14:03 EDT    Performed On:  07/08/2021 14:01 EDT by Tresa Endo, RN, Rex Kras               Triage   Numeric Rating Pain Scale :   5 = Moderate pain   Chief Complaint :   Pt states she is breaking out in bumps x2 days. Pt states they hurt and itch.   [place] Mode of Arrival :   Private vehicle   Infectious Disease Documentation :   Document assessment   Temperature Oral :   36.6 degC(Converted to: 97.9 degF)    Heart Rate Monitored :   70 bpm   Respiratory Rate :   16 br/min   Systolic Blood Pressure :   121 mmHg   Diastolic Blood Pressure :   72 mmHg   SpO2 :   100 %   Patient presentation :   None of the above   Chief Complaint or Presentation suggest infection :   No   Weight Dosing :   88 kg(Converted to: 194 lb 0 oz)    Height :   163 cm(Converted to: 5 ft 4 in)    Body Mass Index Dosing :   33 kg/m2   Dwaine Gale - 07/08/2021 14:01 EDT   DCP GENERIC CODE   Tracking Acuity :   4   Tracking Group :   ED [address] Tracking Group   Tresa Endo, RNRex Kras - 07/08/2021 14:01 EDT   ED General Section :   Document assessment   Pregnancy Status :   Patient denies   ED Allergies Section :   Document assessment   ED Reason for Visit Section :   Document assessment   ED Quick Assessment :   Patient appears awake, alert, oriented to baseline. Skin warm and dry. Moves all extremities. Respiration even and unlabored. Appears in no apparent distress.   Tresa Endo, RN, Rex Kras - 07/08/2021 14:01 EDT   ID Risk Screen Symptoms   Recent Travel History :   No recent travel   TB Symptom Screen :   No symptoms   Last 90 days COVID-19 ID :   No   Close Contact with COVID-19 ID :   No   Last 14 days COVID-19 ID :   No   Tresa Endo RNRex Kras - 07/08/2021 14:01 EDT   Allergies   (As Of: 07/08/2021 14:03:28 EDT)   Allergies (Active)   No Known Medication Allergies  Estimated Onset Date:   Unspecified ; Created ByLeanna Battles, RN, JENNIFER L; Reaction Status:   Active ; Category:   Drug  ; Substance:   No Known Medication Allergies ; Type:   Allergy ; Updated By:   Leanna Battles RN, Lise Auer; Reviewed Date:   07/08/2021 14:02 EDT        Psycho-Social   Last 3 mo, thoughts killing self/others :   Patient denies   Right click within box for Suspected Abuse policy link. :   None   Feels Safe Where Live :   Yes   ED Behavioral Activity Rating Scale :   4 - Quiet and awake (normal level of activity)   Dwaine Gale - 07/08/2021 14:01 EDT   ED Reason for Visit   (As Of: 07/08/2021 14:03:28 EDT)   Problems(Active)    No Chronic Problems (Cerner  :NKP )  Name of Problem:   No Chronic Problems ; Recorder:   Roanna Banning C-RN; Code:   NKP ; Last Updated:   08/15/2020 13:17 EST ; Life Cycle Date:   08/15/2020 ; Life Cycle Status:   Active ; Vocabulary:   Cerner          Diagnoses(Active)    Rash  Date:   07/08/2021 ; Diagnosis Type:   Reason For Visit ; Confirmation:   Complaint of ; Clinical Dx:   Rash ; Classification:   Medical ; Clinical Service:   Non-Specified ; Code:   PNED ; Probability:   0 ; Diagnosis Code:   [id number]

## 2021-07-08 NOTE — ED Notes (Signed)
ED Triage Note       ED Secondary Triage Entered On:  07/08/2021 15:41 EDT    Performed On:  07/08/2021 15:41 EDT by Freida Busman, RN, Raleigh Lions               General Information   Barriers to Learning :   None evident   ED Home Meds Section :   Document assessment   [employer] ED Fall Risk Section :   Document assessment   ED Advance Directives Section :   Document assessment   ED Palliative Screen :   N/A (prefilled for <20yo)   Consuela Mimes D - 07/08/2021 15:41 EDT   (As Of: 07/08/2021 15:41:52 EDT)   Problems(Active)    No Chronic Problems (Cerner  :NKP )  Name of Problem:   No Chronic Problems ; Recorder:   Roanna Banning C-RN; Code:   NKP ; Last Updated:   08/15/2020 13:17 EST ; Life Cycle Date:   08/15/2020 ; Life Cycle Status:   Active ; Vocabulary:   Cerner          Diagnoses(Active)    Rash  Date:   07/08/2021 ; Diagnosis Type:   Reason For Visit ; Confirmation:   Complaint of ; Clinical Dx:   Rash ; Classification:   Medical ; Clinical Service:   Non-Specified ; Code:   PNED ; Probability:   0 ; Diagnosis Code:   [id number]             -    Procedure History   (As Of: 07/08/2021 15:41:52 EDT)     Phoebe Perch Fall Risk Assessment Tool   Hx of falling last 3 months ED Fall :   No   Patient confused or disoriented ED Fall :   No   Patient intoxicated or sedated ED Fall :   No   Patient impaired gait ED Fall :   No   Use a mobility assistance device ED Fall :   No   Patient altered elimination ED Fall :   No   [facility] ED Fall Score :   0    Freida Busman RNRaleigh Lions - 07/08/2021 15:41 EDT   ED Advance Directive   Advance Directive :   No   Freida Busman RN, [place] Lions - 07/08/2021 15:41 EDT   Med Hx   Medication List   (As Of: 07/08/2021 15:41:52 EDT)   No Known Home Medications     Freida Busman RNRaleigh Lions - 07/08/2021 15:41:35

## 2021-07-18 ENCOUNTER — Ambulatory Visit (HOSPITAL_COMMUNITY): Admit: 2021-07-18 | Payer: Self-pay

## 2021-07-18 ENCOUNTER — Encounter (HOSPITAL_COMMUNITY): Payer: Self-pay | Admitting: Emergency Medicine

## 2021-07-18 ENCOUNTER — Ambulatory Visit (HOSPITAL_COMMUNITY)
Admission: EM | Admit: 2021-07-18 | Discharge: 2021-07-18 | Disposition: A | Discharge status: Home / Self Care | Payer: Medicaid Other | Attending: Urgent Care | Admitting: Urgent Care

## 2021-07-18 ENCOUNTER — Other Ambulatory Visit: Payer: Self-pay

## 2021-07-18 DIAGNOSIS — K121 Other forms of stomatitis: Secondary | ICD-10-CM

## 2021-07-18 MED ORDER — VALACYCLOVIR HCL 1 G PO TABS: 1000.0000 mg | ORAL_TABLET | Freq: Three times a day (TID) | ORAL | 0 refills | Status: AC

## 2021-07-18 NOTE — ED Triage Notes (Signed)
Pt reports that has sores on lips for several days. Reports will ooze and are painful and itch.  Reports had allergic reaction few weeks ago with rash all over body.

## 2021-07-18 NOTE — ED Provider Notes (Signed)
  [facility]   MRN: [id number] DOB: 07-28-2001  Subjective:   Alyssa Wright is a 20 y.o. female presenting for 2 to 3-day history of acute onset blisters over the upper and lower lips.  Symptoms are not painful but sometimes itchy.  She was in close proximity to her cousin recently while doing her health and she had very similar rash over the lips.  No current facility-administered medications for this encounter.  Current Outpatient Medications:    ferrous sulfate (FERROUSUL) 325 (65 FE) MG tablet, Take 1 tablet (325 mg total) by mouth daily with breakfast. (Patient not taking: Reported on 09/29/2018), Disp: 60 tablet, Rfl: 1   ibuprofen (ADVIL,MOTRIN) 600 MG tablet, Take 1 tablet (600 mg total) by mouth every 6 (six) hours as needed., Disp: 20 tablet, Rfl: 0   Prenatal Vit-Fe Fumarate-FA (PRENATAL VITAMINS) 28-0.8 MG TABS, Take 1 tablet by mouth daily., Disp: 30 tablet, Rfl: 5   senna-docusate (SENOKOT-S) 8.6-50 MG tablet, Take 2 tablets by mouth daily., Disp: 60 tablet, Rfl: 0   No Known Allergies  Past Medical History:  Diagnosis Date   Medical history non-contributory      Past Surgical History:  Procedure Laterality Date   NO PAST SURGERIES      Family History  Problem Relation Age of Onset   Heart disease Mother     Social History   Tobacco Use   Smoking status: Never   Smokeless tobacco: Never  Vaping Use   Vaping Use: Never used  Substance Use Topics   Alcohol use: No   Drug use: No    ROS   Objective:   Vitals: BP 114/74 (BP Location: Right Arm)   Pulse 86   Temp 98.7 F (37.1 C) (Oral)   Resp 17   LMP 07/07/2021   SpO2 98%   Physical Exam Constitutional:      General: She is not in acute distress.    Appearance: Normal appearance. She is well-developed. She is not ill-appearing.  HENT:     Head: Normocephalic and atraumatic.     Nose: Nose normal.     Mouth/Throat:     Mouth: Mucous membranes are moist.     Pharynx:  Oropharynx is clear.   Eyes:     General: No scleral icterus.    Extraocular Movements: Extraocular movements intact.     Pupils: Pupils are equal, round, and reactive to light.  Cardiovascular:     Rate and Rhythm: Normal rate.  Pulmonary:     Effort: Pulmonary effort is normal.  Skin:    General: Skin is warm and dry.  Neurological:     General: No focal deficit present.     Mental Status: She is alert and oriented to person, place, and time.  Psychiatric:        Mood and Affect: Mood normal.        Behavior: Behavior normal.    Assessment and Plan :   PDMP not reviewed this encounter.  1. 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  Viral culture pending. Counseled patient on potential for adverse effects with medications prescribed/recommended today, ER and return-to-clinic precautions discussed, patient verbalized understanding.    Wallis Bamberg, PA-C 07/18/21 1438

## 2021-07-20 LAB — HSV CULTURE AND TYPING

## 2021-07-21 ENCOUNTER — Other Ambulatory Visit: Payer: Self-pay

## 2021-07-21 ENCOUNTER — Encounter (HOSPITAL_COMMUNITY): Payer: Self-pay | Admitting: Emergency Medicine

## 2021-07-21 ENCOUNTER — Ambulatory Visit (HOSPITAL_COMMUNITY)
Admission: EM | Admit: 2021-07-21 | Discharge: 2021-07-21 | Disposition: A | Discharge status: Home / Self Care | Payer: Medicaid Other | Attending: Emergency Medicine | Admitting: Emergency Medicine

## 2021-07-21 DIAGNOSIS — L0100 Impetigo, unspecified: Secondary | ICD-10-CM

## 2021-07-21 DIAGNOSIS — J4521 Mild intermittent asthma with (acute) exacerbation: Secondary | ICD-10-CM

## 2021-07-21 MED ORDER — ALBUTEROL SULFATE HFA 108 (90 BASE) MCG/ACT IN AERS: 2.0000 | INHALATION_SPRAY | RESPIRATORY_TRACT | 0 refills | Status: AC | PRN

## 2021-07-21 MED ORDER — MUPIROCIN 2 % EX OINT: 1.0000 "application " | TOPICAL_OINTMENT | Freq: Two times a day (BID) | CUTANEOUS | 0 refills | Status: AC

## 2021-07-21 MED ORDER — PREDNISONE 10 MG (21) PO TBPK: ORAL_TABLET | ORAL | 0 refills | Status: AC

## 2021-07-21 NOTE — ED Triage Notes (Signed)
Pt presents for follow-up on mouth lesions and also c/o Asthma flare up with chest pain and sob.

## 2021-07-21 NOTE — Discharge Instructions (Addendum)
Use ointment as prescribed on the sores.  Take prednisone as prescribed and use inhaler as needed for asthma.  Follow-up with PCP if minimal improvement.

## 2021-07-21 NOTE — ED Provider Notes (Signed)
[facility]    CSN: [id number] Arrival date & time: 07/21/21  1546      History   Chief Complaint Chief Complaint  Patient presents with   Mouth Lesions   Shortness of Breath   Chest Pain    HPI Alyssa Wright is a 20 y.o. female.   Patient presents with two concerns. She returns for evaluation of lesions on her lips. She was seen on 11/8 and prescribed Valtrex for likely HSV, however the culture returned negative. She took the Valtrex for three days before stopping it yesterday after getting the negative culture result. She reports the sores are not any better, and possibly worse. They are located just on/around her lips. She denies any sores inside her mouth or to elsewhere on her body. She has not tried anything for it beside the Valtrex.  She also reports asthma flare with increased wheezing and chest tightness. She reports similar symptoms in the past from her asthma. She does not currently have an inhaler. The patient denies cough, fever, or feeling unwell overall.   The history is provided by the patient.  Mouth Lesions Associated symptoms: no congestion, no fever, no rash and no sore throat   Shortness of Breath Associated symptoms: chest pain and wheezing   Associated symptoms: no cough, no fever, no headaches, no rash, no sore throat and no vomiting   Chest Pain Associated symptoms: shortness of breath   Associated symptoms: no cough, no dizziness, no dysphagia, no fatigue, no fever, no headache, no nausea and no vomiting    Past Medical History:  Diagnosis Date   Medical history non-contributory     Patient Active Problem List   Diagnosis Date Noted   Pregnancy 09/29/2018   Anemia affecting pregnancy in third trimester 08/05/2018   Late prenatal care affecting pregnancy in third trimester 07/30/2018   Encounter for supervision of normal first pregnancy in third trimester 07/30/2018    Past Surgical History:  Procedure Laterality Date   NO PAST  SURGERIES      OB History     Gravida  1   Para  1   Term  1   Preterm      AB      Living  1      SAB      IAB      Ectopic      Multiple  0   Live Births  1            Home Medications    Prior to Admission medications   Medication Sig Start Date End Date Taking? Authorizing Provider  albuterol (VENTOLIN HFA) 108 (90 Base) MCG/ACT inhaler Inhale 2 puffs into the lungs every 4 (four) hours as needed for wheezing or shortness of breath. 07/21/21  Yes Khadija Thier L, PA  mupirocin ointment (BACTROBAN) 2 % Apply 1 application topically 2 (two) times daily. 07/21/21  Yes Baila Rouse L, PA  predniSONE (STERAPRED UNI-PAK 21 TAB) 10 MG (21) TBPK tablet Take as directed 07/21/21  Yes Joeli Fenner L, PA  ferrous sulfate (FERROUSUL) 325 (65 FE) MG tablet Take 1 tablet (325 mg total) by mouth daily with breakfast. Patient not taking: Reported on 09/29/2018 08/27/18   Hermina Staggers, MD  ibuprofen (ADVIL,MOTRIN) 600 MG tablet Take 1 tablet (600 mg total) by mouth every 6 (six) hours as needed. 10/05/18   Charlett Nose, MD  Prenatal Vit-Fe Fumarate-FA (PRENATAL VITAMINS) 28-0.8 MG TABS Take 1 tablet by mouth  daily. 07/24/18   Leftwich-Kirby, Kathie Dike, CNM  senna-docusate (SENOKOT-S) 8.6-50 MG tablet Take 2 tablets by mouth daily. 10/03/18   Daisy Floro, DO  valACYclovir (VALTREX) 1000 MG tablet Take 1 tablet (1,000 mg total) by mouth 3 (three) times daily. 07/18/21   Jaynee Eagles, PA-C    Family History Family History  Problem Relation Age of Onset   Heart disease Mother     Social History Social History   Tobacco Use   Smoking status: Never   Smokeless tobacco: Never  Vaping Use   Vaping Use: Never used  Substance Use Topics   Alcohol use: No   Drug use: No     Allergies   Patient has no known allergies.   Review of Systems Review of Systems  Constitutional:  Negative for chills, fatigue and fever.  HENT:  Positive for mouth sores. Negative for  congestion, sore throat and trouble swallowing.   Respiratory:  Positive for shortness of breath and wheezing. Negative for cough.   Cardiovascular:  Positive for chest pain.  Gastrointestinal:  Negative for diarrhea, nausea and vomiting.  Skin:  Negative for rash.  Neurological:  Negative for dizziness and headaches.    Physical Exam Triage Vital Signs ED Triage Vitals  Enc Vitals Group     BP 07/21/21 1626 138/83     Pulse Rate 07/21/21 1626 77     Resp 07/21/21 1626 16     Temp 07/21/21 1626 98.4 F (36.9 C)     Temp Source 07/21/21 1626 Oral     SpO2 07/21/21 1626 98 %     Weight --      Height --      Head Circumference --      Peak Flow --      Pain Score 07/21/21 1625 8     Pain Loc --      Pain Edu? --      Excl. in [place]? --    No data found.  Updated Vital Signs BP 138/83 (BP Location: Right Arm)   Pulse 77   Temp 98.4 F (36.9 C) (Oral)   Resp 16   LMP 07/07/2021   SpO2 98%   Visual Acuity Right Eye Distance:   Left Eye Distance:   Bilateral Distance:    Right Eye Near:   Left Eye Near:    Bilateral Near:     Physical Exam Vitals and nursing note reviewed.  Constitutional:      General: She is not in acute distress. HENT:     Head: Normocephalic.     Mouth/Throat:     Lips: Lesions present.     Tongue: No lesions.     Palate: No lesions.     Pharynx: Oropharynx is clear. No pharyngeal swelling.     Comments: Multiple lesions to lips and perioral skin - appear to be consistent with impetigo with yellow crusting/covering. No purulence. Tender. No current vesicles noted.  Cardiovascular:     Rate and Rhythm: Normal rate and regular rhythm.     Heart sounds: Normal heart sounds.  Pulmonary:     Effort: Pulmonary effort is normal. No respiratory distress.     Breath sounds: No stridor. Wheezing (very mild, diffusely) present. No rhonchi or rales.  Musculoskeletal:     Cervical back: Normal range of motion.  Lymphadenopathy:     Cervical: No  cervical adenopathy.  Skin:    Findings: No rash.  Neurological:     Mental Status: She  is alert.  Psychiatric:        Mood and Affect: Mood normal.     UC Treatments / Results  Labs (all labs ordered are listed, but only abnormal results are displayed) Labs Reviewed - No data to display  EKG   Radiology No results found.  Procedures Procedures (including critical care time)  Medications Ordered in UC Medications - No data to display  Initial Impression / Assessment and Plan / UC Course  I have reviewed the triage vital signs and the nursing notes.  Pertinent labs & imaging results that were available during my care of the patient were reviewed by me and considered in my medical decision making (see chart for details).     Suspect impetigo given initial vesicles and now yellow crusting, as well as negative HSV culture. Empiric abx with mupirocin ointment.  Asthma flare, mild, tx w pred and albuterol.   E/M: 1 acute uncomplicated illness & 1 chronic condition with exacerbation, no data, moderate risk due to prescription management  Final Clinical Impressions(s) / UC Diagnoses   Final diagnoses:  Impetigo  Mild intermittent asthma with exacerbation     Discharge Instructions      Use ointment as prescribed on the sores.  Take prednisone as prescribed and use inhaler as needed for asthma.  Follow-up with PCP if minimal improvement.      ED Prescriptions     Medication Sig Dispense Auth. Provider   mupirocin ointment (BACTROBAN) 2 % Apply 1 application topically 2 (two) times daily. 22 g Abner Greenspan, Auna Mikkelsen L, PA   predniSONE (STERAPRED UNI-PAK 21 TAB) 10 MG (21) TBPK tablet Take as directed 1 each Abner Greenspan, Camryn Quesinberry L, PA   albuterol (VENTOLIN HFA) 108 (90 Base) MCG/ACT inhaler Inhale 2 puffs into the lungs every 4 (four) hours as needed for wheezing or shortness of breath. 1 each Delsa Sale, PA      PDMP not reviewed this encounter.   Delsa Sale, [place] 07/21/21  1657

## 2021-08-02 ENCOUNTER — Other Ambulatory Visit: Payer: Self-pay

## 2022-04-19 ENCOUNTER — Emergency Department: Admit: 2022-04-19 | Payer: MEDICAID | Primary: Sports Medicine

## 2022-04-19 ENCOUNTER — Inpatient Hospital Stay: Admit: 2022-04-19 | Discharge: 2022-04-19 | Disposition: A | Discharge status: Home / Self Care | Payer: MEDICAID

## 2022-04-19 DIAGNOSIS — S6991XA Unspecified injury of right wrist, hand and finger(s), initial encounter: Secondary | ICD-10-CM

## 2022-04-19 MED ORDER — HYDROCODONE-ACETAMINOPHEN 5-325 MG PO TABS
5-325 MG | Freq: Once | ORAL | Status: AC
Start: 2022-04-19 — End: 2022-04-19
  Administered 2022-04-19: 05:00:00 1 via ORAL

## 2022-04-19 MED ORDER — IBUPROFEN 800 MG PO TABS
800 MG | Freq: Once | ORAL | Status: AC
Start: 2022-04-19 — End: 2022-04-19
  Administered 2022-04-19: 05:00:00 800 mg via ORAL

## 2022-04-19 MED ORDER — IBUPROFEN 800 MG PO TABS
800 MG | ORAL_TABLET | Freq: Three times a day (TID) | ORAL | 0 refills | Status: AC | PRN
Start: 2022-04-19 — End: 2022-04-24

## 2022-04-19 MED ORDER — TRAMADOL HCL 50 MG PO TABS
50 MG | ORAL_TABLET | Freq: Three times a day (TID) | ORAL | 0 refills | Status: AC | PRN
Start: 2022-04-19 — End: 2022-04-24

## 2022-04-19 MED FILL — HYDROCODONE-ACETAMINOPHEN 5-325 MG PO TABS: 5-325 MG | ORAL | Qty: 1

## 2022-04-19 MED FILL — IBUPROFEN 800 MG PO TABS: 800 MG | ORAL | Qty: 1

## 2022-04-19 NOTE — ED Provider Notes (Signed)
[facility] EMERGENCY DEPT  EMERGENCY DEPARTMENT ENCOUNTER      Pt Name: Barbara Hanna  MRN: [id number]  Birthdate [birth year]  Date of evaluation: 04/19/2022  Provider: Vanice Sarah, PA-C    CHIEF COMPLAINT       Chief Complaint   Patient presents with    Wrist Injury     Pt arrives today c/o R wrist pain and swelling. Pt states she "was playing football 3 hours ago and fell on wrist; pain has just gotten worse." No obvious deformity, swelling noted in triage.         HISTORY OF PRESENT ILLNESS   (Location/Symptom, Timing/Onset, Context/Setting, Quality, Duration, Modifying Factors, Severity)  Note limiting factors.   Veanna Dower is a 21 y.o. female who presents to the emergency department      21 y/o AAF with c/o right wrist pain that developed last night.  Patient fell while playing football with family members.  Denies head injury or LOC.  Patient is right handed.      The history is provided by the patient.     Nursing Notes were reviewed.    REVIEW OF SYSTEMS    (2-9 systems for level 4, 10 or more for level 5)     Review of Systems   Constitutional: Negative.    HENT: Negative.     Respiratory: Negative.     Gastrointestinal: Negative.    Musculoskeletal:  Positive for arthralgias.   Skin: Negative.    Neurological: Negative.    Psychiatric/Behavioral: Negative.       Except as noted above the remainder of the review of systems was reviewed and negative.       PAST MEDICAL HISTORY   No past medical history on file.      SURGICAL HISTORY     No past surgical history on file.      CURRENT MEDICATIONS       Discharge Medication List as of 04/19/2022  1:43 AM          ALLERGIES     Patient has no known allergies.    FAMILY HISTORY     No family history on file.       SOCIAL HISTORY       Social History     Socioeconomic History    Marital status: Single       SCREENINGS         Glasgow Coma Scale  Eye Opening: Spontaneous  Best Verbal Response: Oriented  Best Motor Response: Obeys commands  Glasgow Coma Scale  Score: 15                     CIWA Assessment  BP: 121/85  Pulse: 68                 PHYSICAL EXAM    (up to 7 for level 4, 8 or more for level 5)     ED Triage Vitals [04/19/22 0020]   BP Temp Temp Source Pulse Respirations SpO2 Height Weight - Scale   121/85 97.4 F (36.3 C) Oral 68 16 100 % 5\' 5"  (1.651 m) 193 lb (87.5 kg)       Physical Exam  Vitals and nursing note reviewed.   Constitutional:       Appearance: Normal appearance.   HENT:      Head: Normocephalic and atraumatic.   Pulmonary:      Effort: Pulmonary effort is normal.   Musculoskeletal:  Right wrist: Swelling, bony tenderness and snuff box tenderness present. Decreased range of motion.   Skin:     General: Skin is warm and dry.   Neurological:      Mental Status: She is alert and oriented to person, place, and time.   Psychiatric:         Mood and Affect: Mood normal.         Behavior: Behavior normal.       DIAGNOSTIC RESULTS         RADIOLOGY:   Non-plain film images such as CT, Ultrasound and MRI are read by the radiologist. Plain radiographic images are visualized and preliminarily interpreted by the emergency physician with the below findings:        Interpretation per the Radiologist below, if available at the time of this note:    XR WRIST RIGHT (MIN 3 VIEWS)    (Results Pending)       EMERGENCY DEPARTMENT COURSE and DIFFERENTIAL DIAGNOSIS/MDM:   Vitals:    Vitals:    04/19/22 0020   BP: 121/85   Pulse: 68   Resp: 16   Temp: 97.4 F (36.3 C)   TempSrc: Oral   SpO2: 100%   Weight: 87.5 kg   Height: 5\' 5"  (1.651 m)           Medical Decision Making  No obvious acute fracture seen on x-rays.  Given physical examination, we will place patient in a thumb spica splint and have her f/u with the hand specialist for further evaluation.     Risk  Prescription drug management.          PROCEDURES:  Unless otherwise noted below, none     Splint Application    Date/Time: 04/19/2022 3:34 AM  Performed by: 06/19/2022, PA-C  Authorized by:  Vanice Sarah, PA-C     Consent:     Consent obtained:  Verbal    Consent given by:  Patient    Risks discussed:  Numbness, pain and swelling    Alternatives discussed:  No treatment  Universal protocol:     Procedure explained and questions answered to patient or proxy's satisfaction: yes      Patient identity confirmed:  Verbally with patient  Pre-procedure details:     Distal neurologic exam:  Normal    Distal perfusion: distal pulses strong and brisk capillary refill    Procedure details:     Location:  Wrist    Wrist location:  R wrist    Splint type:  Thumb spica    Supplies:  Cotton padding (Orthoglass)    Attestation: Splint applied and adjusted personally by me    Post-procedure details:     Distal neurologic exam:  Unchanged    Procedure completion:  Tolerated        FINAL IMPRESSION      1. Injury of right wrist, initial encounter          DISPOSITION/PLAN   DISPOSITION Decision To Discharge 04/19/2022 01:40:27 AM      PATIENT REFERRED TO:  06/19/2022, MD  [address] [place]  [phone]    Schedule an appointment as soon as possible for a visit in 5 days        DISCHARGE MEDICATIONS:  Discharge Medication List as of 04/19/2022  1:43 AM        START taking these medications    Details   ibuprofen (ADVIL;MOTRIN)  800 MG tablet Take 1 tablet by mouth every 8 hours as needed for Pain, Disp-15 tablet, R-0Print      traMADol (ULTRAM) 50 MG tablet Take 1 tablet by mouth every 8 hours as needed for Pain for up to 5 days. Intended supply: 3 days. Take lowest dose possible to manage pain Max Daily Amount: 150 mg, Disp-15 tablet, R-0Print           Controlled Substances Monitoring:     No flowsheet data found.    (Please note that portions of this note were completed with a voice recognition program.  Efforts were made to edit the dictations but occasionally words are mis-transcribed.)    Diona Peregoy Koleen Nimrod, PA-C (electronically signed)  Attending Emergency  Physician           Vanice Sarah, PA-C  04/19/22 [phone]

## 2022-04-26 NOTE — Telephone Encounter (Signed)
-----   Message from Wilfred Lacy, [place] sent at 04/19/2022 11:22 AM EDT -----    ----- Message -----  From: Automatic Discharge Provider  Sent: 04/19/2022   1:56 AM EDT  To: Vincente Poli, MD

## 2022-04-26 NOTE — Telephone Encounter (Signed)
Lvm to call back to get scheduled

## 2022-05-01 NOTE — Telephone Encounter (Signed)
Lvm to call if an appointment is needed

## 2022-12-12 ENCOUNTER — Encounter
